# Patient Record
Sex: Female | Born: 1985 | Race: White | Hispanic: No | Marital: Married | Smoking: Former smoker
Health system: Southern US, Community
[De-identification: ages and names within clinical notes are randomized; demographics above are authoritative.]

## PROBLEM LIST (undated history)

## (undated) DIAGNOSIS — N200 Calculus of kidney: Secondary | ICD-10-CM

## (undated) DIAGNOSIS — B019 Varicella without complication: Secondary | ICD-10-CM

## (undated) DIAGNOSIS — F411 Generalized anxiety disorder: Secondary | ICD-10-CM

## (undated) HISTORY — DX: Generalized anxiety disorder: F41.1

## (undated) HISTORY — DX: Calculus of kidney: N20.0

## (undated) HISTORY — PX: LITHOTRIPSY: SUR834

## (undated) HISTORY — DX: Varicella without complication: B01.9

## (undated) HISTORY — PX: URETERAL STENT PLACEMENT: SHX822

---
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
## 2006-12-12 DIAGNOSIS — B009 Herpesviral infection, unspecified: Secondary | ICD-10-CM

## 2006-12-12 HISTORY — DX: Herpesviral infection, unspecified: B00.9

## 2012-05-12 ENCOUNTER — Ambulatory Visit (INDEPENDENT_AMBULATORY_CARE_PROVIDER_SITE_OTHER): Payer: 59 | Admitting: Physician Assistant

## 2012-05-12 ENCOUNTER — Other Ambulatory Visit: Payer: Self-pay | Admitting: Physician Assistant

## 2012-05-12 VITALS — BP 120/76 | HR 80 | Temp 98.6°F | Resp 18 | Ht 64.0 in | Wt 125.0 lb

## 2012-05-12 DIAGNOSIS — J029 Acute pharyngitis, unspecified: Secondary | ICD-10-CM

## 2012-05-12 DIAGNOSIS — R599 Enlarged lymph nodes, unspecified: Secondary | ICD-10-CM

## 2012-05-12 DIAGNOSIS — R590 Localized enlarged lymph nodes: Secondary | ICD-10-CM

## 2012-05-12 DIAGNOSIS — R509 Fever, unspecified: Secondary | ICD-10-CM

## 2012-05-12 LAB — POCT CBC
HCT, POC: 43.9 % (ref 37.7–47.9)
Hemoglobin: 13.4 g/dL (ref 12.2–16.2)
MCHC: 30.5 g/dL — AB (ref 31.8–35.4)
MID (cbc): 0.8 (ref 0–0.9)
MPV: 9.1 fL (ref 0–99.8)
Platelet Count, POC: 272 10*3/uL (ref 142–424)
RBC: 4.4 M/uL (ref 4.04–5.48)
RDW, POC: 13.2 %

## 2012-05-12 LAB — POCT INFLUENZA A/B: Influenza A, POC: NEGATIVE

## 2012-05-12 LAB — POCT RAPID STREP A (OFFICE): Rapid Strep A Screen: NEGATIVE

## 2012-05-12 MED ORDER — CLARITHROMYCIN ER 500 MG PO TB24: 1000.0000 mg | ORAL_TABLET | Freq: Every day | ORAL | Status: DC

## 2012-05-12 MED ORDER — FIRST-DUKES MOUTHWASH MT SUSP: 10.0000 mL | OROMUCOSAL | Status: DC | PRN

## 2012-05-12 NOTE — Patient Instructions (Addendum)
Take the antibiotic as directed.  Use Magic Mouthwash as frequently as every 2 hours for sore throat.  Continue using Advil for sore throat, use up to 800mg  3 times daily.  Drink plenty of water and get plenty of rest.  Do not smoke.  I will let you know when I have culture data back.  Please let us know if you are worsening or not improving.

## 2012-05-12 NOTE — Progress Notes (Signed)
Subjective:    Patient ID: Pamela Richards, female    DOB: Oct 09, 1985, 26 y.o.   MRN: [id number]  HPI   Ms. Pamela Richards is a 25 yr old female here with sore throat, lymphadenopathy, low grade fever, generalized muscle/body aches.  Symptoms began 2 days ago.  Throat is sore and "swollen", hurts to flex neck, hurts to talk, hurts to swallow.  No difficulty swallowing.  Some ear pain.  Cough is non-productive.  Denies NVD.  Does have muscle and body aches.  Tmax 100F.  Has been alternating Tylenol and Motrin q2hrs since Saturday night.  Yesterday stopped tylenol and began using Dayquil/Nyqyil.  Currently smokes 1/2 ppd but has not been smoking while sick.  Few sick contacts at work this week.  Has not had flu shot.  State she has had mono previously.      Review of Systems  Constitutional: Positive for fever. Negative for chills and appetite change.  HENT: Positive for ear pain, congestion, sore throat, rhinorrhea, neck pain, postnasal drip and sinus pressure. Negative for trouble swallowing.   Respiratory: Positive for cough. Negative for shortness of breath and wheezing.   Cardiovascular: Negative.   Gastrointestinal: Negative.   Musculoskeletal: Positive for myalgias and arthralgias.  Skin: Negative.   Neurological: Positive for headaches. Negative for dizziness, syncope and light-headedness.       Objective:   Physical Exam  Vitals reviewed. Constitutional: She is oriented to person, place, and time. She appears well-developed and well-nourished. No distress.  HENT:  Head: Normocephalic and atraumatic.  Right Ear: Ear canal normal. Tympanic membrane is injected and retracted.  Left Ear: Tympanic membrane and ear canal normal.  Nose: Nose normal. Right sinus exhibits no maxillary sinus tenderness and no frontal sinus tenderness. Left sinus exhibits no maxillary sinus tenderness and no frontal sinus tenderness.  Mouth/Throat: Uvula is midline and mucous membranes are normal. Posterior  oropharyngeal edema and posterior oropharyngeal erythema present. No oropharyngeal exudate or tonsillar abscesses.  Neck: Normal range of motion. Neck supple. Muscular tenderness present. No spinous process tenderness present. No rigidity. No Brudzinski's sign and no Kernig's sign noted.  Cardiovascular: Normal rate, regular rhythm, normal heart sounds and intact distal pulses.  Exam reveals no gallop and no friction rub.   No murmur heard. Pulmonary/Chest: Effort normal and breath sounds normal. She has no wheezes. She has no rales.  Abdominal: Soft. Bowel sounds are normal. There is no tenderness. There is no rebound and no guarding.  Lymphadenopathy:       Head (right side): Submandibular adenopathy present.       Head (left side): Submandibular adenopathy present.    She has cervical adenopathy.       Right cervical: Superficial cervical adenopathy present.  Neurological: She is alert and oriented to person, place, and time.  Skin: Skin is warm and dry.  Psychiatric: She has a normal mood and affect. Her behavior is normal.      Filed Vitals:   05/12/12 1052  BP: 120/76  Pulse: 80  Temp: 98.6 F (37 C)  Resp: 18      Results for orders placed in visit on 05/12/12  POCT RAPID STREP A (OFFICE)      Component Value Range   Rapid Strep A Screen Negative  Negative  POCT INFLUENZA A/B      Component Value Range   Influenza A, POC Negative     Influenza B, POC Negative    POCT CBC      Component  Value Range   WBC 14.2 (*) 4.6 - 10.2 K/uL   Lymph, poc 2.7  0.6 - 3.4   POC LYMPH PERCENT 19.1  10 - 50 %L   MID (cbc) 0.8  0 - 0.9   POC MID % 5.6  0 - 12 %M   POC Granulocyte 10.7 (*) 2 - 6.9   Granulocyte percent 75.3  37 - 80 %G   RBC 4.40  4.04 - 5.48 M/uL   Hemoglobin 13.4  12.2 - 16.2 g/dL   HCT, POC 16.1  09.6 - 47.9 %   MCV 99.7 (*) 80 - 97 fL   MCH, POC 30.5  27 - 31.2 pg   MCHC 30.5 (*) 31.8 - 35.4 g/dL   RDW, POC 04.5     Platelet Count, POC 272  142 - 424 K/uL     MPV 9.1  0 - 99.8 fL        Assessment & Plan:   1. Sore throat  POCT rapid strep A, POCT CBC, Throat culture, Diphenhyd-Hydrocort-Nystatin (FIRST-DUKES MOUTHWASH) SUSP, clarithromycin (BIAXIN XL) 500 MG 24 hr tablet  2. Anterior cervical adenopathy  POCT rapid strep A, POCT CBC, Throat culture, clarithromycin (BIAXIN XL) 500 MG 24 hr tablet  3. Fever  POCT rapid strep A, POCT Influenza A/B, POCT CBC, Throat culture     Ms. Pamela Richards is a 26 yr old female with sore throat, cervical lymphadenopathy, and fever.  Rapid strep is negative.  Rapid flu is negative.  WBC count is 14.2, diff is normal.  Throat culture is pending through [facility] as pt is an employee there.  Throat is erythematous but without exudate.  There is no tonsillar abscess.  Sinuses are nontender.  Cough is minimal and non-productive.  Etiology of infection is unclear, but will cover for upper respiratory infection/pharyngitis.  Given pt allergies will treat with clarithromycin.  Will also treat symptoms with Magic Mouthwash and otc NSAIDs.  Considered a steroid to reduce throat inflammation/pain, but am concerned about losing the ability to follow her white count.  I am hopeful that getting abx on board will reduce the pain and inflammation.  Discussed RTC precautions and patient is in agreement with this plan.

## 2012-05-19 ENCOUNTER — Telehealth: Payer: Self-pay | Admitting: *Deleted

## 2012-05-19 NOTE — Telephone Encounter (Signed)
[facility] could not use specimen that we sent to them, due to it not being the gel.  Throat culture was not performed.  Would you like for [place] to call pt to check her status?

## 2012-05-19 NOTE — Telephone Encounter (Signed)
Please call pt and explain that [facility] did not accept our culture, so it was not done.  Hopefully she is feeling better after the antibiotics

## 2012-05-19 NOTE — Telephone Encounter (Signed)
Called patient she is advised, she feels better and states this is fine,, her lymph nodes have decreased in size and her pharyngitis has resolved. Roddy Bellamy

## 2016-06-01 ENCOUNTER — Ambulatory Visit (INDEPENDENT_AMBULATORY_CARE_PROVIDER_SITE_OTHER): Payer: 59 | Admitting: Family Medicine

## 2016-06-01 ENCOUNTER — Encounter: Payer: Self-pay | Admitting: Family Medicine

## 2016-06-01 VITALS — BP 107/70 | HR 84 | Temp 98.6°F | Ht 63.0 in | Wt 131.4 lb

## 2016-06-01 DIAGNOSIS — F411 Generalized anxiety disorder: Secondary | ICD-10-CM | POA: Insufficient documentation

## 2016-06-01 DIAGNOSIS — N200 Calculus of kidney: Secondary | ICD-10-CM | POA: Diagnosis not present

## 2016-06-01 DIAGNOSIS — L709 Acne, unspecified: Secondary | ICD-10-CM | POA: Insufficient documentation

## 2016-06-01 DIAGNOSIS — Z0000 Encounter for general adult medical examination without abnormal findings: Secondary | ICD-10-CM | POA: Diagnosis not present

## 2016-06-01 DIAGNOSIS — Z23 Encounter for immunization: Secondary | ICD-10-CM

## 2016-06-01 MED ORDER — ADAPALENE-BENZOYL PEROXIDE 0.1-2.5 % EX GEL: CUTANEOUS | 5 refills | Status: DC

## 2016-06-01 NOTE — Progress Notes (Signed)
Pre visit review using our clinic review tool, if applicable. No additional management support is needed unless otherwise documented below in the visit note. 

## 2016-06-01 NOTE — Patient Instructions (Addendum)
Tdap today  Sign release of information at the check out desk for last pap smear only  Schedule a lab visit at the check out desk within 2 weeks. Return for future fasting labs meaning nothing but water after midnight please. Ok to take your medications with water. Could not order the urine at [facility] as a point of care test  Mychart?   Trial epiduo for 1 month. If not doing better- consider oral antibiotic. Will need husbands help for back application  Adapalene; Benzoyl Peroxide topical gel What is this medicine? ADAPALENE; BENZOYL PEROXIDE (a DAP a leen; BEN zoe ill per OX ide) is used on the skin to treat acne. This medicine may be used for other purposes; ask your health care provider or pharmacist if you have questions. COMMON BRAND NAME(S): Epiduo What should I tell my health care provider before I take this medicine? They need to know if you have any of these conditions: -eczema -seborrheic dermatitis -skin abrasions -sunburn -an unusual or allergic reaction to adapalene, benzoyl peroxide, vitamin A, other medicines, foods, dyes, or preservatives -pregnant or trying to get pregnant -breast-feeding How should I use this medicine? This medicine is for external use only. Follow the directions on the prescription label. Cleanse the affected area with a mild or soapless cleanser and pat dry. Apply a thin layer of medicine to the affected area. Rub in gently. Do not get in the eyes, on the lips, or on any other areas of sensitive skin. Use your medicine at regular intervals. Do not use it more often than directed. Talk to your pediatrician regarding the use of this medicine in children. Special care may be needed. Overdosage: If you think you have taken too much of this medicine contact a poison control center or emergency room at once. NOTE: This medicine is only for you. Do not share this medicine with others. What if I miss a dose? If you miss a dose, use it as soon as you can. If  it is almost time for your next dose, use only that dose. Do not use double or extra doses. What may interact with this medicine? -other acne medicines -salicylic acid or sulfur containing products -topical antibiotics like clindamycin or erythromycin This list may not describe all possible interactions. Give your health care provider a list of all the medicines, herbs, non-prescription drugs, or dietary supplements you use. Also tell them if you smoke, drink alcohol, or use illegal drugs. Some items may interact with your medicine. What should I watch for while using this medicine? Your acne may get worse at first, and then should start to get better. It may take 2 to 12 weeks before you see the full effect. Do not use products that may dry the skin like medicated cosmetics, products that contain alcohol, astringents, spices, limes, or abrasive soaps or cleaners. Do not use other acne or skin treatments on the same area that you use this medicine unless your doctor or health care professional tells you to. If you use these together they can cause severe skin irritation. This medicine can make you more sensitive to the sun. Keep out of the sun. If you cannot avoid being in the sun, wear protective clothing and use sunscreen. Do not use sun lamps or tanning beds/booths. This medicine may bleach hair or colored fabrics. Avoid getting the medicine on your clothes. What side effects may I notice from receiving this medicine? Side effects that you should report to your doctor or health  care professional as soon as possible: -allergic reactions like skin rash, itching or hives, swelling of the face, lips, or tongue -severe burning, redness, crusting, or swelling of the treated areas Side effects that usually do not require medical attention (report to your doctor or health care professional if they continue or are bothersome): -increased sensitivity to the sun -inflamed, stinging, and irritated  skin -skin that peels after a few days of use This list may not describe all possible side effects. Call your doctor for medical advice about side effects. You may report side effects to FDA at 1-800-FDA-1088. Where should I keep my medicine? Keep out of the reach of children. Store at room temperature between 15 and 30 degrees C (59 and 86 degrees F). Protect from light. Keep container tightly closed. Throw away any unused medication after the expiration date. NOTE: This sheet is a summary. It may not cover all possible information. If you have questions about this medicine, talk to your doctor, pharmacist, or health care provider.  2017 Elsevier/Gold Standard (2007-08-26 17:41:57)

## 2016-06-01 NOTE — Progress Notes (Addendum)
Phone: [phone]  Subjective:  Patient presents today for their annual medical physical and to establish care with me as PCP (prior Dr. Caryl NeverBurchette years ago). Chief complaint-noted.   See problem oriented charting- Review of Systems  Constitutional: Negative for chills and fever.  HENT: Negative for hearing loss and tinnitus.   Eyes: Negative for blurred vision and double vision.  Respiratory: Negative for cough and hemoptysis.   Cardiovascular: Negative for chest pain and palpitations.  Gastrointestinal: Negative for heartburn and nausea.  Genitourinary: Negative for dysuria and urgency.  Musculoskeletal: Negative for myalgias and neck pain.  Skin: Negative for itching and rash.  Neurological: Positive for headaches. Negative for dizziness.  Endo/Heme/Allergies: Negative for polydipsia. Does not bruise/bleed easily.  Psychiatric/Behavioral: Negative for depression. The patient is nervous/anxious.    The following were reviewed and entered/updated in epic: Past Medical History:  Diagnosis Date  . Chicken pox   . GAD (generalized anxiety disorder)    also history of depression now controlled. zoloft workedbest. Lexapro had to take same time every day or SE.   Marland Kitchen. Kidney stones    [facility] prior   Patient Active Problem List   Diagnosis Date Noted  . Acne 06/01/2016  . GAD (generalized anxiety disorder)   . Kidney stones    Past Surgical History:  Procedure Laterality Date  . LITHOTRIPSY    . URETERAL STENT PLACEMENT     x2    Family History  Problem Relation Age of Onset  . Cancer Paternal Grandmother     Medications- reviewed and updated, no meds prior to visit  Allergies-reviewed and updated Allergies  Allergen Reactions  . Bactrim [Sulfamethoxazole-Trimethoprim]   . Ceclor [Cefaclor]   . Penicillins     Social History   Social History  . Marital status: Married    Spouse name: N/A  . Number of children: N/A  . Years of education: N/A    Social History Main Topics  . Smoking status: Current Every Day Smoker    Packs/day: 2.00    Types: Cigarettes  . Smokeless tobacco: Current User  . Alcohol use Yes  . Drug use: No  . Sexual activity: Yes    Birth control/ protection: Pill   Other Topics Concern  . None   Social History Narrative   Married. Husband Ronaldo MiyamotoKyle patient of Dr. Durene CalHunter. No kids. 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 Oropharynx normal Neck: no thyromegaly CV: RRR no murmurs rubs or gallops Lungs: CTAB no crackles, wheeze, rhonchi Abdomen: soft/nontender/nondistended/normal bowel sounds. No rebound or guarding.  Ext: no edema Skin: warm, dry, pustules and papules on chin as well as all over back Neuro: grossly normal, moves all extremities, PERRLA  Assessment/Plan:  30 y.o. female presenting for annual physical.  Health Maintenance counseling: 1. Anticipatory guidance: Patient counseled regarding regular dental exams, eye exams- glasses, wearing seatbelts.  2. Risk factor reduction:  Advised patient of need for regular exercise (active at work but advised regular exercise outside of  work) and diet rich and fruits and vegetables to reduce risk of heart attack and stroke. Healthy weight.  3. Immunizations/screenings/ancillary studies Immunization History  Administered Date(s) Administered  . Influenza-Unspecified 01/31/2016  . Tdap 06/01/2016   Health Maintenance Due  Topic Date Due  . HIV Screening - had previously in college 03/06/2002  . TETANUS/TDAP - today 03/06/2006  . PAP SMEAR - yearly records  03/06/2008  . INFLUENZA VACCINE - had at work 01/10/2016   4. Cervical cancer screening- will get records 5. Breast cancer screening-  No family history- start 35-40 but does breast exams with gyn and mammogram  6. Colon cancer screening - no family history, start at age 29. Has had colonoscopy for GI issues normal in around 2009 7. Skin cancer screening- no concerns  Status of chronic or acute concerns  Tdap today  Sign release of information at the check out desk for last pap smear only  Schedule a lab visit at the check out desk within 2 weeks. Return for future fasting labs meaning nothing but water after midnight please. Ok to take your medications with water. Could not order the urine at [facility] as a point of care test  Mychart? Plans to sign up her and husband at home  Advised quit smoking  Cut alcohol down to at least 7 or less per week- at about 12 at times.   Acne Trial epiduo for 1 month. If not doing better- consider oral antibiotic. Will need husbands help for back application. apprently with very sensitive skin and some discomfort with topicals in past- we discussed dermatology referral if cannot tolerate topicals as advised to have these in place before oral antibiotics.    1 year CPE likely, back for fasting labs  Orders Placed This Encounter  Procedures  . Tdap vaccine greater than or equal to 7yo IM  . Comprehensive metabolic panel    Volo    Standing Status:   Future    Standing Expiration Date:   06/01/2017  . CBC    Standing Status:   Future    Standing Expiration Date:   06/01/2017  . Lipid panel    Standing Status:   Future    Standing Expiration Date:   06/01/2017  . TSH    Standing Status:   Future    Standing Expiration Date:   06/01/2017  . POCT Urinalysis Dipstick (Automated)    Standing Status:   Future    Standing Expiration Date:   06/01/2017    Meds ordered this encounter  Medications  . Adapalene-Benzoyl Peroxide 0.1-2.5 % gel     Sig: Apply a pea-sized amount for each area of the face and/or trunk (eg, forehead, chin, each cheek). Skin should be clean and dry before applying. For external use only; avoid applying to eyes, lips, and mucous membranes. Do not apply to cuts, abrasions, eczematous or sunburned skin.    Dispense:  45 g    Refill:  5    Return precautions advised.   Tana ConchStephen Rhylen Shaheen, MD

## 2016-06-01 NOTE — Assessment & Plan Note (Signed)
Trial epiduo for 1 month. If not doing better- consider oral antibiotic. Will need husbands help for back application. apprently with very sensitive skin and some discomfort with topicals in past- we discussed dermatology referral if cannot tolerate topicals as advised to have these in place before oral antibiotics.

## 2016-06-08 ENCOUNTER — Other Ambulatory Visit (INDEPENDENT_AMBULATORY_CARE_PROVIDER_SITE_OTHER): Payer: 59

## 2016-06-08 DIAGNOSIS — Z0000 Encounter for general adult medical examination without abnormal findings: Secondary | ICD-10-CM

## 2016-06-08 LAB — POC URINALSYSI DIPSTICK (AUTOMATED)
Blood, UA: NEGATIVE
Glucose, UA: NEGATIVE
Ketones, UA: NEGATIVE
LEUKOCYTES UA: NEGATIVE
NITRITE UA: NEGATIVE
PH UA: 6
Spec Grav, UA: 1.025
Urobilinogen, UA: 0.2

## 2016-06-08 LAB — LIPID PANEL
Cholesterol: 123 mg/dL (ref 0–200)
HDL: 45 mg/dL (ref 35–70)
LDL CALC: 65 mg/dL

## 2016-06-08 LAB — CBC AND DIFFERENTIAL
HCT: 41 % (ref 36–46)
Hemoglobin: 13.5 g/dL (ref 12.0–16.0)

## 2016-06-08 LAB — BASIC METABOLIC PANEL
BUN: 10 mg/dL (ref 4–21)
CREATININE: 0.7 mg/dL (ref <=1.1)
Glucose: 82 mg/dL

## 2016-06-09 LAB — CBC
HEMATOCRIT: 40.5 % (ref 34.0–46.6)
HEMOGLOBIN: 13.5 g/dL (ref 11.1–15.9)
MCH: 30.8 pg (ref 26.6–33.0)
MCHC: 33.3 g/dL (ref 31.5–35.7)
MCV: 92 fL (ref 79–97)
Platelets: 272 10*3/uL (ref 150–379)
RBC: 4.39 x10E6/uL (ref 3.77–5.28)
RDW: 13.7 % (ref 12.3–15.4)
WBC: 6 10*3/uL (ref 3.4–10.8)

## 2016-06-09 LAB — COMPREHENSIVE METABOLIC PANEL
ALK PHOS: 72 IU/L (ref 39–117)
ALT: 12 IU/L (ref 0–32)
AST: 15 IU/L (ref 0–40)
Albumin/Globulin Ratio: 1.9 (ref 1.2–2.2)
Albumin: 4.2 g/dL (ref 3.5–5.5)
BILIRUBIN TOTAL: 0.4 mg/dL (ref 0.0–1.2)
BUN/Creatinine Ratio: 14 (ref 9–23)
BUN: 10 mg/dL (ref 6–20)
CHLORIDE: 103 mmol/L (ref 96–106)
CO2: 21 mmol/L (ref 18–29)
Calcium: 9 mg/dL (ref 8.7–10.2)
Creatinine, Ser: 0.71 mg/dL (ref 0.57–1.00)
GFR calc Af Amer: 133 mL/min/{1.73_m2} (ref >59)
GFR calc non Af Amer: 115 mL/min/{1.73_m2} (ref >59)
GLUCOSE: 82 mg/dL (ref 65–99)
Globulin, Total: 2.2 g/dL (ref 1.5–4.5)
Potassium: 4.2 mmol/L (ref 3.5–5.2)
Sodium: 141 mmol/L (ref 134–144)
Total Protein: 6.4 g/dL (ref 6.0–8.5)

## 2016-06-09 LAB — LIPID PANEL
CHOL/HDL RATIO: 2.7 ratio (ref 0.0–4.4)
CHOLESTEROL TOTAL: 123 mg/dL (ref 100–199)
HDL: 45 mg/dL (ref >39)
LDL CALC: 65 mg/dL (ref 0–99)
TRIGLYCERIDES: 63 mg/dL (ref 0–149)
VLDL Cholesterol Cal: 13 mg/dL (ref 5–40)

## 2016-06-09 LAB — TSH: TSH: 0.959 u[IU]/mL (ref 0.450–4.500)

## 2016-06-12 ENCOUNTER — Encounter: Payer: Self-pay | Admitting: Family Medicine

## 2017-07-17 LAB — HM PAP SMEAR: HM Pap smear: NORMAL

## 2018-08-11 LAB — HM PAP SMEAR
HM Pap smear: NORMAL
HPV, high-risk: NEGATIVE

## 2018-08-20 ENCOUNTER — Other Ambulatory Visit: Payer: Self-pay

## 2018-08-20 ENCOUNTER — Encounter: Payer: Self-pay | Admitting: Physician Assistant

## 2018-08-20 ENCOUNTER — Ambulatory Visit (INDEPENDENT_AMBULATORY_CARE_PROVIDER_SITE_OTHER): Payer: 59

## 2018-08-20 ENCOUNTER — Ambulatory Visit: Payer: 59 | Admitting: Family Medicine

## 2018-08-20 ENCOUNTER — Ambulatory Visit (INDEPENDENT_AMBULATORY_CARE_PROVIDER_SITE_OTHER): Payer: 59 | Admitting: Physician Assistant

## 2018-08-20 DIAGNOSIS — M25512 Pain in left shoulder: Secondary | ICD-10-CM | POA: Diagnosis not present

## 2018-08-20 DIAGNOSIS — M542 Cervicalgia: Secondary | ICD-10-CM | POA: Diagnosis not present

## 2018-08-20 DIAGNOSIS — M79605 Pain in left leg: Secondary | ICD-10-CM | POA: Diagnosis not present

## 2018-08-20 MED ORDER — CYCLOBENZAPRINE HCL 5 MG PO TABS: 5.0000 mg | ORAL_TABLET | Freq: Three times a day (TID) | ORAL | 0 refills | Status: DC | PRN

## 2018-08-20 NOTE — Patient Instructions (Signed)
It was great to see you!  We will be in touch with your xray results.  Use ibuprofen and daily antacid (pepcid/prilosec/etc).  Flexeril as needed for muscle spasms.  Take care,  Jarold Motto PA-C
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
## 2018-09-29 ENCOUNTER — Encounter: Payer: Self-pay | Admitting: Family Medicine

## 2021-04-25 ENCOUNTER — Ambulatory Visit (INDEPENDENT_AMBULATORY_CARE_PROVIDER_SITE_OTHER): Payer: 59 | Admitting: Family Medicine

## 2021-04-25 ENCOUNTER — Other Ambulatory Visit: Payer: Self-pay | Admitting: Family Medicine

## 2021-04-25 ENCOUNTER — Ambulatory Visit (HOSPITAL_COMMUNITY)
Admission: RE | Admit: 2021-04-25 | Discharge: 2021-04-25 | Disposition: A | Discharge status: Home / Self Care | Payer: 59 | Source: Ambulatory Visit | Attending: Family Medicine | Admitting: Family Medicine

## 2021-04-25 ENCOUNTER — Encounter: Payer: Self-pay | Admitting: Family Medicine

## 2021-04-25 ENCOUNTER — Ambulatory Visit: Payer: 59 | Admitting: Physician Assistant

## 2021-04-25 ENCOUNTER — Other Ambulatory Visit: Payer: Self-pay

## 2021-04-25 VITALS — BP 110/80 | HR 80 | Temp 98.3°F | Ht 63.0 in | Wt 143.6 lb

## 2021-04-25 DIAGNOSIS — N200 Calculus of kidney: Secondary | ICD-10-CM

## 2021-04-25 DIAGNOSIS — R109 Unspecified abdominal pain: Secondary | ICD-10-CM | POA: Insufficient documentation

## 2021-04-25 LAB — CBC WITH DIFFERENTIAL/PLATELET
Basophils Absolute: 0 10*3/uL (ref 0.0–0.1)
Basophils Relative: 0.9 % (ref 0.0–3.0)
Eosinophils Absolute: 0.2 10*3/uL (ref 0.0–0.7)
Eosinophils Relative: 2.9 % (ref 0.0–5.0)
HCT: 41.3 % (ref 36.0–46.0)
Hemoglobin: 13.8 g/dL (ref 12.0–15.0)
Lymphocytes Relative: 39.9 % (ref 12.0–46.0)
Lymphs Abs: 2.1 10*3/uL (ref 0.7–4.0)
MCHC: 33.4 g/dL (ref 30.0–36.0)
MCV: 96.7 fl (ref 78.0–100.0)
Monocytes Absolute: 0.5 10*3/uL (ref 0.1–1.0)
Monocytes Relative: 9.5 % (ref 3.0–12.0)
Neutro Abs: 2.4 10*3/uL (ref 1.4–7.7)
Neutrophils Relative %: 46.8 % (ref 43.0–77.0)
Platelets: 314 10*3/uL (ref 150.0–400.0)
RBC: 4.27 Mil/uL (ref 3.87–5.11)
RDW: 12.8 % (ref 11.5–15.5)
WBC: 5.2 10*3/uL (ref 4.0–10.5)

## 2021-04-25 LAB — POC URINALSYSI DIPSTICK (AUTOMATED)
Bilirubin, UA: NEGATIVE
Blood, UA: NEGATIVE
Glucose, UA: NEGATIVE
Ketones, UA: NEGATIVE
Leukocytes, UA: NEGATIVE
Nitrite, UA: NEGATIVE
Protein, UA: NEGATIVE
Spec Grav, UA: >=1.03 — AB (ref 1.010–1.025)
Urobilinogen, UA: 0.2 E.U./dL
pH, UA: 6 (ref 5.0–8.0)

## 2021-04-25 LAB — COMPREHENSIVE METABOLIC PANEL
ALT: 11 U/L (ref 0–35)
AST: 14 U/L (ref 0–37)
Albumin: 4.4 g/dL (ref 3.5–5.2)
Alkaline Phosphatase: 72 U/L (ref 39–117)
BUN: 16 mg/dL (ref 6–23)
CO2: 26 mEq/L (ref 19–32)
Calcium: 9 mg/dL (ref 8.4–10.5)
Chloride: 107 mEq/L (ref 96–112)
Creatinine, Ser: 0.64 mg/dL (ref 0.40–1.20)
GFR: 115.53 mL/min (ref >60.00)
Glucose, Bld: 92 mg/dL (ref 70–99)
Potassium: 4 mEq/L (ref 3.5–5.1)
Sodium: 139 mEq/L (ref 135–145)
Total Bilirubin: 0.5 mg/dL (ref 0.2–1.2)
Total Protein: 7.1 g/dL (ref 6.0–8.3)

## 2021-04-25 MED ORDER — DICLOFENAC SODIUM 50 MG PO TBEC: 50.0000 mg | DELAYED_RELEASE_TABLET | Freq: Three times a day (TID) | ORAL | 1 refills | Status: DC

## 2021-04-25 MED ORDER — OXYCODONE-ACETAMINOPHEN 5-325 MG PO TABS: 1.0000 | ORAL_TABLET | ORAL | 0 refills | Status: AC | PRN

## 2021-04-25 NOTE — Addendum Note (Signed)
Addended by: Lorn Junes on: 04/25/2021 10:25 AM   Modules accepted: Orders

## 2021-04-25 NOTE — Progress Notes (Signed)
Phone [phone] In person visit   Subjective:   Pamela Richards is a 35 y.o. year old very pleasant female patient who presents for/with See problem oriented charting Chief Complaint  Patient presents with   Kidney Stone    Pt c/o kidney pain off and on for several weeks and got worse Sunday morning. Pt has taken a demerol and it helped.   This visit occurred during the SARS-CoV-2 public health emergency.  Safety protocols were in place, including screening questions prior to the visit, additional usage of staff PPE, and extensive cleaning of exam room while observing appropriate contact time as indicated for disinfecting solutions.   Past Medical History-  Patient Active Problem List   Diagnosis Date Noted   Acne 06/01/2016   GAD (generalized anxiety disorder)    Kidney stones     Medications- reviewed and updated No medication prior to visit    Objective:  BP 110/80   Pulse 80   Temp 98.3 F (36.8 C)   Ht 5\' 3"  (1.6 m)   Wt 143 lb 9.6 oz (65.1 kg)   SpO2 98%   BMI 25.44 kg/m  Gen: Appears uncomfortable/in pain CV: RRR no murmurs rubs or gallops Lungs: CTAB no crackles, wheeze, rhonchi Abdomen: soft/nontender/nondistended/normal bowel sounds. No rebound or guarding.  -Points to CVA as source of pain but no significant worsening of pain with palpation Ext: no edema Skin: warm, dry     Assessment and Plan  # Kidney Stone concern due to left CVA pain   S:Patient reports noticed some intermittent back pain for last few weeks but on Sunday morning started with pain in left CVA area- severe pain up to 10/10- laying on heating pad. Stayed home yesterday from work with pain coming in waves up to 6/10 and still doing the same today. Old pain medicine demerol helpful for pain- when wearing off pain would spike again. No blood in urine. Normal urination. Drinking lots of fluids.  -No dysuria or polyuria  At least 5 years since last kidney stone- urologist is out of  [place].    Condoms for birth control- LMP 04/18/21 and regular.  A/P: Patient with left CVA pain and history of kidney stones-strongly suspect left-sided kidney stone-update labs today to evaluate renal function, kidney function, and anemia.  Stat renal stone study and likely refer to urology depending on size-also consider Flomax (depending on size) and NSAIDs-1 to make sure renal function okay for NSAIDs first  # Current smoker S:2-3 cigarettes per week. Goal to quit by end of year.   A/P: Not ready to quit at the moment but strongly encourage cessation  Recommended follow up: Return for as needed for new, worsening, persistent symptoms. Future Appointments  Date Time Provider Department Center  04/25/2021 10:20 AM 04/27/2021, MD [facility]   Lab/Order associations:   ICD-10-CM   1. Kidney stone  N20.0     2. Left flank pain  R10.9 Comprehensive metabolic panel    CBC with Differential/Platelet    POCT Urinalysis Dipstick (Automated)    CT RENAL STONE STUDY    3. Abdominal pain, unspecified abdominal location  R10.9       Meds ordered this encounter  Medications   oxyCODONE-acetaminophen (PERCOCET/ROXICET) 5-325 MG tablet    Sig: Take 1 tablet by mouth every 4 (four) hours as needed for up to 5 days for severe pain (do not drive for 8 hours after taking. kidney stone pain).    Dispense:  20 tablet    Refill:  0    Time Spent: 28 minutes of total time (9:47 AM- 10:15 AM) was spent on the date of the encounter performing the following actions: chart review prior to seeing the patient, obtaining history, performing a medically necessary exam, counseling on the treatment plan, placing orders, and documenting in our EHR.    I,Clevinger Phan,acting as a Neurosurgeon for Tana Conch, MD.,have documented all relevant documentation on the behalf of Tana Conch, MD,as directed by  Tana Conch, MD while in the presence of Tana Conch, MD.   I, Tana Conch, MD, have  reviewed all documentation for this visit. The documentation on 04/25/21 for the exam, diagnosis, procedures, and orders are all accurate and complete.   Return precautions advised.  Tana Conch, MD

## 2021-04-25 NOTE — Patient Instructions (Addendum)
Please stop by lab before you go If you have mychart- we will send your results within 3 business days of [place] receiving them.  If you do not have mychart- we will call you about results within 5 business days of [place] receiving them.  *please also note that you will see labs on mychart as soon as they post. I will later go in and write notes on them- will say "notes from Dr. Durene Cal"  Team please get patient set up for stat CT scan for kidney stones- already ordered  Start Percocet for every 4 hours as needed I would say for pain 5/10 or above - do not drive/operate heavy machinery for 8 hours after taking this medication.  Encourage complete smoking cessation for your long term health.  Recommended follow up: Return for as needed for new, worsening, persistent symptoms. - schedule physical within 6 months

## 2021-04-27 ENCOUNTER — Other Ambulatory Visit: Payer: Self-pay

## 2021-04-27 ENCOUNTER — Telehealth: Payer: Self-pay

## 2021-04-27 DIAGNOSIS — N200 Calculus of kidney: Secondary | ICD-10-CM

## 2021-04-27 NOTE — Telephone Encounter (Signed)
Unable to reach pt by phone, below message sent via mychart.

## 2021-04-27 NOTE — Telephone Encounter (Signed)
I looked this up after our visit yesterday and essentially Flomax works better for stone 5 mm up to 10 mm and hers is 4 mm.  That is why decided not to send in the Flomax because unclear if it would benefit her based on my reading  From up-to-date "In patients with ureteral stones >5 mm and ?10 mm in diameter, we suggest treatment with the alpha blocker tamsulosin (0.4 mg once daily) for up to four weeks to facilitate spontaneous stone passage. If tamsulosin is not available, use of another alpha blocker (such as terazosin, doxazosin, alfuzosin, or silodosin) is reasonable. Although most evidence exists for distal ureteral stones, given the low side effect profile of alpha blockers, we choose to give alpha blockers to patients with stones >5 mm and ?10 mm in any location of the ureter. Patients are then reimaged if spontaneous passage has not occurred (see 'Confirming stone passage' below). We do not administer an alpha blocker or any other form of MET in patients with ureteral stones ?5 mm or >10 mm in diameter."

## 2021-04-27 NOTE — Telephone Encounter (Signed)
Pt called and would like to know if a prescription for flomax could be sent in for her since you want her to try and pass the stone on her own.

## 2021-12-25 ENCOUNTER — Telehealth: Payer: Self-pay | Admitting: Family Medicine

## 2021-12-25 DIAGNOSIS — M549 Dorsalgia, unspecified: Secondary | ICD-10-CM

## 2021-12-25 NOTE — Telephone Encounter (Signed)
We could work her in but what about direct referral to sports medicine? They would be a great choice for this
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
## 2021-12-25 NOTE — Telephone Encounter (Signed)
See below

## 2021-12-26 NOTE — Telephone Encounter (Signed)
FYI-- Patient has been scheduled with Dr. Denyse Amass for 12/29/21 at 9:45am.

## 2021-12-26 NOTE — Telephone Encounter (Signed)
Referral to sports meds placed.

## 2021-12-28 NOTE — Progress Notes (Signed)
Subjective:    CC: Low back pain  I, Molly Weber, LAT, ATC, am serving as scribe for Dr. Clementeen Graham.  HPI: Pt is a 36 y/o female presenting w/ c/o low back pain x one week.  She locates her pain to her entire lower back, R>L, and radiating into her B hips/groin.  Per pt report, pt was seen at the ED in [place], [place] and had a CT scan that indicates that she has a R L5 pars interarticularis fx.  She states that her initial "injury" occurred when she leaned forward to dry herself off after getting out of the shower and heard/felt a pop in the R side of her low back/hip/abdomen area and has been having pain ever since.  Radiating pain: yes into her B ant hip, groin and ant thigh to her knee LE paresthesias: no Aggravating factors: general movement; sitting; unable to sleep well due to the pain Treatment tried: Naproxen, muscle relaxer, lidocaine patches  Pertinent review of Systems: No fevers or chills  Relevant historical information: Pars defect right side   Objective:    Vitals:   12/29/21 0938  BP: 100/70  Pulse: 81  SpO2: 98%   General: Well Developed, well nourished, and in no acute distress.   MSK: L-spine: Nontender midline.  Tender palpation right lumbar paraspinal musculature. Decreased lumbar motion with pain. Extremity strength is intact. Reflexes are intact distally.  Lab and Radiology Results  CT scan images visible on lumbar spine obtained during CT scan abdomen and pelvis November 2022 personally and independently interpreted. No visible pars defect present during this exam.   Impression and Recommendations:    Assessment and Plan: 36 y.o. female with right low back pain with pain radiating down the right leg in an L3 or L4 dermatomal pattern.  Patient had a CT scan obtained in the outside hospital which reportedly shows concern for a right L4-5 pars defects or pars fracture.  This occurred without trauma.  At the time of this note I do not have  access to these records but we have requested medical records and will addend the note once obtained. Compared to images from November 2022 this appears to be new.  Based on the degree of pain and radicular symptoms that she has we will obtain MRI of the lumbar spine to further characterize source of symptoms and for potential injection or other treatment planning.  For now MRI lumbar spine and refer to PT for core stabilization strategy.  Medication management including gabapentin and tizanidine.  Backup course of prednisone prescribed to use if worsening..  Likely will proceed directly to epidural steroid injection based on MRI.  PDMP not reviewed this encounter. Orders Placed This Encounter  Procedures   MR Lumbar Spine Wo Contrast    Standing Status:   Future    Standing Expiration Date:   12/30/2022    Order Specific Question:   What is the patient's sedation requirement?    Answer:   No Sedation    Order Specific Question:   Does the patient have a pacemaker or implanted devices?    Answer:   No    Order Specific Question:   Preferred imaging location?    Answer:   [facility] (table limit-350lbs)   Ambulatory referral to Physical Therapy    Referral Priority:   Routine    Referral Type:   Physical Medicine    Referral Reason:   Specialty Services Required    Requested Specialty:  Physical Therapy    Number of Visits Requested:   1   Meds ordered this encounter  Medications   predniSONE (DELTASONE) 50 MG tablet    Sig: Take 1 tablet (50 mg total) by mouth daily with breakfast for 5 days.    Dispense:  5 tablet    Refill:  0   gabapentin (NEURONTIN) 300 MG capsule    Sig: Take 1 capsule (300 mg total) by mouth 3 (three) times daily.    Dispense:  90 capsule    Refill:  2   tiZANidine (ZANAFLEX) 4 MG tablet    Sig: Take 1 tablet (4 mg total) by mouth every 8 (eight) hours as needed for muscle spasms.    Dispense:  30 tablet    Refill:  1    Discussed warning  signs or symptoms. Please see discharge instructions. Patient expresses understanding.   The above documentation has been reviewed and is accurate and complete Clementeen Graham, M.D.

## 2021-12-29 ENCOUNTER — Ambulatory Visit (INDEPENDENT_AMBULATORY_CARE_PROVIDER_SITE_OTHER): Payer: 59 | Admitting: Family Medicine

## 2021-12-29 ENCOUNTER — Encounter: Payer: Self-pay | Admitting: Family Medicine

## 2021-12-29 VITALS — BP 100/70 | HR 81 | Ht 63.0 in | Wt 144.6 lb

## 2021-12-29 DIAGNOSIS — M4306 Spondylolysis, lumbar region: Secondary | ICD-10-CM

## 2021-12-29 DIAGNOSIS — M5441 Lumbago with sciatica, right side: Secondary | ICD-10-CM | POA: Diagnosis not present

## 2021-12-29 MED ORDER — TIZANIDINE HCL 4 MG PO TABS: 4.0000 mg | ORAL_TABLET | Freq: Three times a day (TID) | ORAL | 1 refills | Status: DC | PRN

## 2021-12-29 MED ORDER — GABAPENTIN 300 MG PO CAPS: 300.0000 mg | ORAL_CAPSULE | Freq: Three times a day (TID) | ORAL | 2 refills | Status: DC

## 2021-12-29 MED ORDER — PREDNISONE 50 MG PO TABS: 50.0000 mg | ORAL_TABLET | Freq: Every day | ORAL | 0 refills | Status: AC

## 2021-12-29 NOTE — Patient Instructions (Addendum)
Nice to meet you today.  I've ordered a lumbar spine MRI to [facility].  They will call you to schedule but please let us know if you don't hear from them in one week regarding scheduling.  Gaba 300, prednisone, Tizanidine have been ordered to your pharmacy.  PT to [facility].  Their office will call you to schedule but please let us know if you don't hear from them in one week regarding scheduling.  Follow-up: 6 weeks

## 2022-01-01 ENCOUNTER — Ambulatory Visit (INDEPENDENT_AMBULATORY_CARE_PROVIDER_SITE_OTHER): Payer: 59 | Admitting: Physical Therapy

## 2022-01-01 ENCOUNTER — Encounter: Payer: Self-pay | Admitting: Physical Therapy

## 2022-01-01 DIAGNOSIS — M5459 Other low back pain: Secondary | ICD-10-CM | POA: Diagnosis not present

## 2022-01-01 NOTE — Therapy (Signed)
OUTPATIENT PHYSICAL THERAPY THORACOLUMBAR EVALUATION   Patient Name: Pamela Richards MRN: [id number] DOB:09-16-1985, 36 y.o., female Today's Date: 01/01/2022   PT End of Session - 01/07/22 2216     Visit Number 1    Number of Visits 16    Date for PT Re-Evaluation 02/12/22    Authorization Type [employer]    PT Start Time 0805    PT Stop Time 0845    PT Time Calculation (min) 40 min    Activity Tolerance Patient tolerated treatment well    Behavior During Therapy WFL for tasks assessed/performed              Past Medical History:  Diagnosis Date   Chicken pox    GAD (generalized anxiety disorder)    also history of depression now controlled. zoloft workedbest. Lexapro had to take same time every day or SE.    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   Kidney stones    [facility] prior   Past Surgical History:  Procedure Laterality Date   LITHOTRIPSY     URETERAL STENT PLACEMENT     x2   Patient Active Problem List   Diagnosis Date Noted   Acne 06/01/2016   GAD (generalized anxiety disorder)    Kidney stones     PCP: Tana Conch  REFERRING PROVIDER: Clementeen Graham  REFERRING DIAG: low back pain  Rationale for Evaluation and Treatment Rehabilitation  THERAPY DIAG:  Other low back pain  ONSET DATE: 2 weeks ago  SUBJECTIVE:                                                                                                                                                                                           SUBJECTIVE STATEMENT: 2 weeks ago, pt had incident where she bent over when getting out of shower, had had significant pain in back.  Has had previous back pain, not similar.  Difficulty sleeping, muscle relaxer- helps with sleep.  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  Unable to drive.  Stairs at home: no.  Previous/recent imaging- pt states pars defect or fracture. Getting another MRI later this week.    PERTINENT  HISTORY:     PAIN:  Are you having pain? Yes: NPRS scale: 7/10 Pain location: low back, down R LE, some tingling.  Pain description: achey,  Aggravating factors: sitting,  Relieving factors: none stated    PRECAUTIONS: Back     WEIGHT BEARING RESTRICTIONS No  FALLS:   Has patient fallen in last 6 months? No   PLOF: Independent  PATIENT GOALS   Decreased  pain, return to regular movement/activity.    OBJECTIVE:   DIAGNOSTIC FINDINGS:     COGNITION:  Overall cognitive status: Within functional limits for tasks assessed   PALPATION: Mild tenderness in low lumbar region.    LUMBAR ROM:   Active  AROM  eval  Flexion Mild/mod limitation/pain  Extension Not tested   Right lateral flexion Mild limitation/pain  Left lateral flexion Mild limitation/pain  Right rotation   Left rotation    (Blank rows = not tested) Hip ROM: WFL     LOWER EXTREMITY MMT:    MMT Right eval Left eval  Hip flexion 4 4  Hip extension    Hip abduction 4 4  Hip adduction    Hip internal rotation 4 4  Hip external rotation 4 4  Knee flexion 5 5  Knee extension 5 5  Ankle dorsiflexion    Ankle plantarflexion    Ankle inversion    Ankle eversion     (Blank rows = not tested)   LUMBAR SPECIAL TESTS:  Not tested    TODAY'S TREATMENT  See below for HEP    PATIENT EDUCATION:  Education details: PT POC, Exam findings, Precautions for movement, HEP Person educated: Patient Education method: Explanation, Demonstration, Tactile cues, Verbal cues, and Handouts Education comprehension: verbalized understanding, returned demonstration, verbal cues required, tactile cues required, and needs further education   HOME EXERCISE PROGRAM: Access Code: [email or web address] URL: [email or web address] Date: 01/03/2022 Prepared by: Sedalia Muta  Exercises - Hooklying Single Knee to Chest Stretch  - 2 x daily - 3 reps - 30 hold - Supine Piriformis Stretch Pulling Heel to Hip  - 2  x daily - 3 reps - 30 hold - Supine Transversus Abdominis Bracing - Hands on Ground  - 1 x daily - 2 sets - 10 reps - 5 hold - Hooklying Clamshell with Resistance  - 1 x daily - 1- 2 sets - 10 reps  ASSESSMENT:  CLINICAL IMPRESSION: Patient presents with primary complaint of increased pain in low back, radiating into LEs.  Recent imaging showing pars defect, but is having another MRI later this week. Pt with decreased ROM, and difficulty with functional movement due to pain. She has strength loss in hips and core. She will benefit from skilled PT to improve deficits, pain, and to improve/restore ability for functional activity and work duties. Discussed some precautions for extension and end range movements at this time.    OBJECTIVE IMPAIRMENTS decreased activity tolerance, decreased mobility, decreased strength, increased muscle spasms, and pain.   ACTIVITY LIMITATIONS carrying, lifting, bending, sitting, standing, squatting, stairs, bathing, and locomotion level  PARTICIPATION LIMITATIONS: cleaning, laundry, shopping, community activity, and yard work  PERSONAL FACTORS  none  are also affecting patient's functional outcome.   REHAB POTENTIAL: Good  CLINICAL DECISION MAKING: Stable/uncomplicated  EVALUATION COMPLEXITY: Low   GOALS: Goals reviewed with patient? Yes  SHORT TERM GOALS: Target date: 01/16/2022  Pt to be independent with initial HEP  Goal status: INITIAL  2.  Pt to demo ability for transfers with mechanics WFL, and minimal pain.   Goal status: INITIAL  3.  Pt to report ability to bend/flex for ADLS with optimal body mechanics and minimal pain.   Goal status: INITIAL    LONG TERM GOALS: Target date: 02/27/2022  Pt to be independent with final HEP  Goal status: INITIAL  2.Pt to report decreased pain in back and LEs to 0-2/10 with work duties and functional activity.   Goal status: INITIAL  3.  Pt to demo ability to bend/squat with optimal body mechanics,  for improved ability for IADLs.   Goal status: INITIAL  4.  Pt to demo improved strength of hips and core to be at least 4+/5/ [facility] for pt age and dx.   Goal status: INITIAL       PLAN: PT FREQUENCY: 1-2x/week  PT DURATION: 8 weeks  PLANNED INTERVENTIONS: Therapeutic exercises, Therapeutic activity, Neuromuscular re-education, Balance training, Gait training, Patient/Family education, Self Care, Joint mobilization, DME instructions, Dry Needling, Electrical stimulation, Spinal manipulation, Spinal mobilization, Cryotherapy, Moist heat, Ultrasound, Ionotophoresis 4mg /ml Dexamethasone, and Manual therapy.  PLAN FOR NEXT SESSION:    , PT, DPT 10:16 PM  01/07/22

## 2022-01-03 ENCOUNTER — Encounter: Payer: Self-pay | Admitting: Physical Therapy

## 2022-01-04 ENCOUNTER — Ambulatory Visit (INDEPENDENT_AMBULATORY_CARE_PROVIDER_SITE_OTHER): Payer: 59 | Admitting: Physical Therapy

## 2022-01-04 DIAGNOSIS — M5459 Other low back pain: Secondary | ICD-10-CM

## 2022-01-06 ENCOUNTER — Ambulatory Visit (INDEPENDENT_AMBULATORY_CARE_PROVIDER_SITE_OTHER): Payer: 59

## 2022-01-06 DIAGNOSIS — M4306 Spondylolysis, lumbar region: Secondary | ICD-10-CM

## 2022-01-07 ENCOUNTER — Encounter: Payer: Self-pay | Admitting: Physical Therapy

## 2022-01-07 NOTE — Therapy (Signed)
OUTPATIENT PHYSICAL THERAPY TREATMENT   Patient Name: Pamela Richards MRN: [id number] DOB:11/14/1985, 36 y.o., female Today's Date: 01/04/2022   PT End of Session - 01/07/22 2219     Visit Number 2    Number of Visits 16    Date for PT Re-Evaluation 02/12/22    Authorization Type [employer]    PT Start Time 0848    PT Stop Time 0929    PT Time Calculation (min) 41 min    Activity Tolerance Patient tolerated treatment well    Behavior During Therapy WFL for tasks assessed/performed              Past Medical History:  Diagnosis Date   Chicken pox    GAD (generalized anxiety disorder)    also history of depression now controlled. zoloft workedbest. Lexapro had to take same time every day or SE.    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   Kidney stones    [facility] prior   Past Surgical History:  Procedure Laterality Date   LITHOTRIPSY     URETERAL STENT PLACEMENT     x2   Patient Active Problem List   Diagnosis Date Noted   Acne 06/01/2016   GAD (generalized anxiety disorder)    Kidney stones     PCP: Garret Reddish  REFERRING PROVIDER: Lynne Leader  REFERRING DIAG: low back pain  Rationale for Evaluation and Treatment Rehabilitation  THERAPY DIAG:  Other low back pain  ONSET DATE: 2 weeks ago  SUBJECTIVE:                                                                                                                                                                                           SUBJECTIVE STATEMENT:  01/04/22: More pain in L back and into LE, continued pain into R as well.  No pain after visit.  May have done more sitting, as she is trying to work from home.     Eval: 2 weeks ago, pt had incident where she bent over when getting out of shower, had had significant pain in back.  Has had previous back pain, not similar.  Difficulty sleeping, muscle relaxer- helps with sleep.  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  Unable to drive.  Stairs at home: no.  Previous/recent imaging- pt states pars defect or fracture. Getting another MRI later this week.    PERTINENT HISTORY:     PAIN:  Are you having pain? Yes: NPRS scale: 7/10 Pain location: low back, down R LE, some tingling.  Pain description: achey,  Aggravating factors: sitting,  Relieving factors: none stated    PRECAUTIONS: Back     WEIGHT BEARING RESTRICTIONS No  FALLS:   Has patient fallen in last 6 months? No   PLOF: Independent  PATIENT GOALS   Decreased pain, return to regular movement/activity.    OBJECTIVE:   DIAGNOSTIC FINDINGS:     COGNITION:  Overall cognitive status: Within functional limits for tasks assessed   PALPATION: Mild tenderness in low lumbar region.    LUMBAR ROM:   Active  AROM  eval  Flexion Mild/mod limitation/pain  Extension Not tested   Right lateral flexion Mild limitation/pain  Left lateral flexion Mild limitation/pain  Right rotation   Left rotation    (Blank rows = not tested) Hip ROM: WFL     LOWER EXTREMITY MMT:    MMT Right eval Left eval  Hip flexion 4 4  Hip extension    Hip abduction 4 4  Hip adduction    Hip internal rotation 4 4  Hip external rotation 4 4  Knee flexion 5 5  Knee extension 5 5  Ankle dorsiflexion    Ankle plantarflexion    Ankle inversion    Ankle eversion     (Blank rows = not tested)   LUMBAR SPECIAL TESTS:  Not tested    TODAY'S TREATMENT   Therapeutic Exercise: Aerobic: Supine:  TA contraction x 10 with breathing; Clam ISO with GTB x 15 with TA;   Seated:  Standing: Stretches: SKTC 30 sec x 3 bil/manual assist  Neuromuscular Re-education: Manual Therapy: STM to bil low lumbar;  Therapeutic Activity: Self Care: on lumbar positioning for sleep and while working/sitting, lumbar positioning for ADLs.    PATIENT EDUCATION:  Education details: PT POC, Exam findings, Precautions for movement, HEP Person educated:  Patient Education method: Explanation, Demonstration, Tactile cues, Verbal cues, and Handouts Education comprehension: verbalized understanding, returned demonstration, verbal cues required, tactile cues required, and needs further education   HOME EXERCISE PROGRAM: Access Code: [email or web address]   ASSESSMENT:  CLINICAL IMPRESSION: Discussed posture and positioning for light flexion, with optimal mechanics for ADLs. Pt with soreness in low lumbar musculature with STM today. She is hesitant for too much movement or activity until upcoming MRI. She has some improved movement quality today for transfers and ther ex. Continued to review precautions, pt with good understanding.     OBJECTIVE IMPAIRMENTS decreased activity tolerance, decreased mobility, decreased strength, increased muscle spasms, and pain.   ACTIVITY LIMITATIONS carrying, lifting, bending, sitting, standing, squatting, stairs, bathing, and locomotion level  PARTICIPATION LIMITATIONS: cleaning, laundry, shopping, community activity, and yard work  PERSONAL FACTORS  none  are also affecting patient's functional outcome.   REHAB POTENTIAL: Good  CLINICAL DECISION MAKING: Stable/uncomplicated  EVALUATION COMPLEXITY: Low   GOALS: Goals reviewed with patient? Yes  SHORT TERM GOALS: Target date: 01/16/2022  Pt to be independent with initial HEP  Goal status: INITIAL  2.  Pt to demo ability for transfers with mechanics WFL, and minimal pain.   Goal status: INITIAL  3.  Pt to report ability to bend/flex for ADLS with optimal body mechanics and minimal pain.   Goal status: INITIAL    LONG TERM GOALS: Target date: 02/27/2022  Pt to be independent with final HEP  Goal status: INITIAL  2.Pt to report decreased pain in back and LEs to 0-2/10 with work duties and functional activity.   Goal status: INITIAL  3.  Pt to demo ability to bend/squat with optimal body mechanics, for improved ability for  IADLs.   Goal status:  INITIAL  4.  Pt to demo improved strength of hips and core to be at least 4+/5/ [facility] for pt age and dx.   Goal status: INITIAL       PLAN: PT FREQUENCY: 1-2x/week  PT DURATION: 8 weeks  PLANNED INTERVENTIONS: Therapeutic exercises, Therapeutic activity, Neuromuscular re-education, Balance training, Gait training, Patient/Family education, Self Care, Joint mobilization, DME instructions, Dry Needling, Electrical stimulation, Spinal manipulation, Spinal mobilization, Cryotherapy, Moist heat, Ultrasound, Ionotophoresis 4mg /ml Dexamethasone, and Manual therapy.  PLAN FOR NEXT SESSION:    , PT, DPT 10:20 PM  01/07/22

## 2022-01-08 ENCOUNTER — Ambulatory Visit (INDEPENDENT_AMBULATORY_CARE_PROVIDER_SITE_OTHER): Payer: 59 | Admitting: Physical Therapy

## 2022-01-08 ENCOUNTER — Encounter: Payer: Self-pay | Admitting: Physical Therapy

## 2022-01-08 DIAGNOSIS — M5459 Other low back pain: Secondary | ICD-10-CM | POA: Diagnosis not present

## 2022-01-08 NOTE — Therapy (Signed)
OUTPATIENT PHYSICAL THERAPY TREATMENT   Patient Name: Pamela Richards MRN: [id number] DOB:Nov 17, 1985, 36 y.o., female Today's Date: 01/08/2022   PT End of Session - 01/08/22 1013     Visit Number 3    Number of Visits 16    Date for PT Re-Evaluation 02/12/22    Authorization Type [employer]    PT Start Time [phone]    PT Stop Time 1012    PT Time Calculation (min) 36 min    Activity Tolerance Patient tolerated treatment well    Behavior During Therapy WFL for tasks assessed/performed               Past Medical History:  Diagnosis Date   Chicken pox    GAD (generalized anxiety disorder)    also history of depression now controlled. zoloft workedbest. Lexapro had to take same time every day or SE.    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   Kidney stones    [facility] prior   Past Surgical History:  Procedure Laterality Date   LITHOTRIPSY     URETERAL STENT PLACEMENT     x2   Patient Active Problem List   Diagnosis Date Noted   Acne 06/01/2016   GAD (generalized anxiety disorder)    Kidney stones     PCP: Tana Conch  REFERRING PROVIDER: Clementeen Graham  REFERRING DIAG: low back pain  Rationale for Evaluation and Treatment Rehabilitation  THERAPY DIAG:  Other low back pain  ONSET DATE: 2 weeks ago  SUBJECTIVE:                                                                                                                                                                                           SUBJECTIVE STATEMENT: Pt states soreness in mid back/bilaterally. Still has pain down L and R LE. R getting a bit better. Had new MRI.     Eval: 2 weeks ago, pt had incident where she bent over when getting out of shower, had had significant pain in back.  Has had previous back pain, not similar.  Difficulty sleeping, muscle relaxer- helps with sleep.  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  Unable to drive.  Stairs at home: no.   Previous/recent imaging- pt states pars defect or fracture. Getting another MRI later this week.    PERTINENT HISTORY:     PAIN:  Are you having pain? Yes: NPRS scale: 7/10 Pain location: low back, down R LE, some tingling.  Pain description: achey,  Aggravating factors: sitting,  Relieving factors: none stated    PRECAUTIONS: Back  WEIGHT BEARING RESTRICTIONS No  FALLS:   Has patient fallen in last 6 months? No   PLOF: Independent  PATIENT GOALS   Decreased pain, return to regular movement/activity.    OBJECTIVE:   DIAGNOSTIC FINDINGS:     COGNITION:  Overall cognitive status: Within functional limits for tasks assessed   PALPATION: Mild tenderness in low lumbar region.    LUMBAR ROM:   Active  AROM  eval  Flexion Mild/mod limitation/pain  Extension Not tested   Right lateral flexion Mild limitation/pain  Left lateral flexion Mild limitation/pain  Right rotation   Left rotation    (Blank rows = not tested) Hip ROM: WFL     LOWER EXTREMITY MMT:    MMT Right eval Left eval  Hip flexion 4 4  Hip extension    Hip abduction 4 4  Hip adduction    Hip internal rotation 4 4  Hip external rotation 4 4  Knee flexion 5 5  Knee extension 5 5  Ankle dorsiflexion    Ankle plantarflexion    Ankle inversion    Ankle eversion     (Blank rows = not tested)   LUMBAR SPECIAL TESTS:  Not tested    TODAY'S TREATMENT   01/08/22: Therapeutic Exercise: Aerobic: Supine:  TA contraction x 10 with breathing; TA with supine march x 10 ; Clam ISO with GTB x 15 with TA;   Seated:  Standing: Stretches: SKTC 30 sec x 3 bil/manual assist , pelvic tilts x 10;  Neuromuscular Re-education: Manual Therapy: STM to bil thoracic and lumbar paraspinals. ;  Therapeutic Activity: Self Care:    PATIENT EDUCATION:  Education details: Reviewed HEP, precautions.  Person educated: Patient Education method: Explanation, Demonstration, Tactile cues, Verbal cues, and  Handouts Education comprehension: verbalized understanding, returned demonstration, verbal cues required, tactile cues required, and needs further education   HOME EXERCISE PROGRAM: Access Code: [email or web address]   ASSESSMENT:  CLINICAL IMPRESSION: Pt will contact dr for clarification on new MRI, if pars fracture is new.She has most pain on R side of back, but still having pain into L LE. Discussed not sitting too long for work. Will progress activity, after MRI clarification, if she has more of a disc issue, will do more extension next visit.   OBJECTIVE IMPAIRMENTS decreased activity tolerance, decreased mobility, decreased strength, increased muscle spasms, and pain.   ACTIVITY LIMITATIONS carrying, lifting, bending, sitting, standing, squatting, stairs, bathing, and locomotion level  PARTICIPATION LIMITATIONS: cleaning, laundry, shopping, community activity, and yard work  PERSONAL FACTORS  none  are also affecting patient's functional outcome.   REHAB POTENTIAL: Good  CLINICAL DECISION MAKING: Stable/uncomplicated  EVALUATION COMPLEXITY: Low   GOALS: Goals reviewed with patient? Yes  SHORT TERM GOALS: Target date: 01/16/2022  Pt to be independent with initial HEP  Goal status: INITIAL  2.  Pt to demo ability for transfers with mechanics WFL, and minimal pain.   Goal status: INITIAL  3.  Pt to report ability to bend/flex for ADLS with optimal body mechanics and minimal pain.   Goal status: INITIAL    LONG TERM GOALS: Target date: 02/27/2022  Pt to be independent with final HEP  Goal status: INITIAL  2.Pt to report decreased pain in back and LEs to 0-2/10 with work duties and functional activity.   Goal status: INITIAL  3.  Pt to demo ability to bend/squat with optimal body mechanics, for improved ability for IADLs.   Goal status: INITIAL  4.  Pt to demo improved  strength of hips and core to be at least 4+/5/ [facility] for pt age and dx.   Goal status:  INITIAL       PLAN: PT FREQUENCY: 1-2x/week  PT DURATION: 8 weeks  PLANNED INTERVENTIONS: Therapeutic exercises, Therapeutic activity, Neuromuscular re-education, Balance training, Gait training, Patient/Family education, Self Care, Joint mobilization, DME instructions, Dry Needling, Electrical stimulation, Spinal manipulation, Spinal mobilization, Cryotherapy, Moist heat, Ultrasound, Ionotophoresis 4mg /ml Dexamethasone, and Manual therapy.  PLAN FOR NEXT SESSION:    , PT, DPT 2:55 PM  01/08/22

## 2022-01-09 ENCOUNTER — Telehealth: Payer: Self-pay | Admitting: Family Medicine

## 2022-01-09 DIAGNOSIS — M5441 Lumbago with sciatica, right side: Secondary | ICD-10-CM

## 2022-01-09 NOTE — Telephone Encounter (Signed)
Epidural steroid injection ordered 

## 2022-01-09 NOTE — Progress Notes (Signed)
Lumbar spine MRI shows a bulging disc pressing on the right L4 nerve root.  This is consistent with the symptoms you were experiencing in clinic. 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  This is seen on the CT scan. We will proceed to physical therapy as ordered as well as an epidural steroid injection.  Please contact [facility] at [phone] to schedule.  Let me know how you feel after the injection.

## 2022-01-11 ENCOUNTER — Ambulatory Visit (INDEPENDENT_AMBULATORY_CARE_PROVIDER_SITE_OTHER): Payer: 59 | Admitting: Physical Therapy

## 2022-01-11 ENCOUNTER — Encounter: Payer: Self-pay | Admitting: Physical Therapy

## 2022-01-11 DIAGNOSIS — M5459 Other low back pain: Secondary | ICD-10-CM | POA: Diagnosis not present

## 2022-01-11 NOTE — Therapy (Signed)
OUTPATIENT PHYSICAL THERAPY TREATMENT   Patient Name: Pamela Richards MRN: [id number] DOB:[birth year], 36 y.o., female Today's Date: 01/11/2022   PT End of Session - 01/11/22 1201     Visit Number 4    Number of Visits 16    Date for PT Re-Evaluation 02/12/22    Authorization Type [employer]    PT Start Time [id number]    PT Stop Time 1015    PT Time Calculation (min) 39 min    Activity Tolerance Patient tolerated treatment well    Behavior During Therapy WFL for tasks assessed/performed                Past Medical History:  Diagnosis Date   Chicken pox    GAD (generalized anxiety disorder)    also history of depression now controlled. zoloft workedbest. Lexapro had to take same time every day or SE.    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   Kidney stones    [facility] prior   Past Surgical History:  Procedure Laterality Date   LITHOTRIPSY     URETERAL STENT PLACEMENT     x2   Patient Active Problem List   Diagnosis Date Noted   Acne 06/01/2016   GAD (generalized anxiety disorder)    Kidney stones     PCP: Garret Reddish  REFERRING PROVIDER: Lynne Leader  REFERRING DIAG: low back pain  Rationale for Evaluation and Treatment Rehabilitation  THERAPY DIAG:  Other low back pain  ONSET DATE: 2 weeks ago  SUBJECTIVE:                                                                                                                                                                                           SUBJECTIVE STATEMENT: Pt states soreness in mid back/bilaterally. Still has pain down L and R LE.    Eval: 2 weeks ago, pt had incident where she bent over when getting out of shower, had had significant pain in back.  Has had previous back pain, not similar.  Difficulty sleeping, muscle relaxer- helps with sleep.  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  Stairs at home: no.  Previous/recent imaging- pt states pars  defect or fracture. Getting another MRI later this week.    PERTINENT HISTORY:     PAIN:  Are you having pain? Yes: NPRS scale: 7/10 Pain location: low back, down R LE, some tingling.  Pain description: achey,  Aggravating factors: sitting,  Relieving factors: none stated    PRECAUTIONS: Back     WEIGHT BEARING RESTRICTIONS No  FALLS:  Has patient fallen in last 6 months? No   PLOF: Independent  PATIENT GOALS   Decreased pain, return to regular movement/activity.    OBJECTIVE:   DIAGNOSTIC FINDINGS:     COGNITION:  Overall cognitive status: Within functional limits for tasks assessed   PALPATION: Mild tenderness in low lumbar region.    LUMBAR ROM:   Active  AROM  eval  Flexion Mild/mod limitation/pain  Extension Not tested   Right lateral flexion Mild limitation/pain  Left lateral flexion Mild limitation/pain  Right rotation   Left rotation    (Blank rows = not tested) Hip ROM: WFL     LOWER EXTREMITY MMT:    MMT Right eval Left eval  Hip flexion 4 4  Hip extension    Hip abduction 4 4  Hip adduction    Hip internal rotation 4 4  Hip external rotation 4 4  Knee flexion 5 5  Knee extension 5 5  Ankle dorsiflexion    Ankle plantarflexion    Ankle inversion    Ankle eversion     (Blank rows = not tested)   LUMBAR SPECIAL TESTS:  Not tested    TODAY'S TREATMENT   01/11/22: Therapeutic Exercise: Aerobic: Supine:  Clam ISO with GTB x 15 with TA;   Bridging x 8;   Seated: sit to stand x 10  Quadruped: TA x 5, TA with UE lifts x 10; Cat cow x 15(limited extension)   Standing: Stretches: Prone laying x 3 min, Prone on elbows x 2 min;  Neuromuscular Re-education: Manual Therapy: STM/IASTM  to bil thoracic and lumbar paraspinals. ;  Therapeutic Activity: Self Care:    PATIENT EDUCATION:  Education details: Reviewed HEP, precautions.  Person educated: Patient Education method: Explanation, Demonstration, Tactile cues, Verbal cues,  and Handouts Education comprehension: verbalized understanding, returned demonstration, verbal cues required, tactile cues required, and needs further education   HOME EXERCISE PROGRAM: Access Code: [email or web address]   ASSESSMENT:  CLINICAL IMPRESSION: Pt having injection on Monday. She has improving movement quality with transfers and gait. Pt to return next week after injection, will progress movement and strength as tolerated.   OBJECTIVE IMPAIRMENTS decreased activity tolerance, decreased mobility, decreased strength, increased muscle spasms, and pain.   ACTIVITY LIMITATIONS carrying, lifting, bending, sitting, standing, squatting, stairs, bathing, and locomotion level  PARTICIPATION LIMITATIONS: cleaning, laundry, shopping, community activity, and yard work  PERSONAL FACTORS  none  are also affecting patient's functional outcome.   REHAB POTENTIAL: Good  CLINICAL DECISION MAKING: Stable/uncomplicated  EVALUATION COMPLEXITY: Low   GOALS: Goals reviewed with patient? Yes  SHORT TERM GOALS: Target date: 01/16/2022  Pt to be independent with initial HEP  Goal status: INITIAL  2.  Pt to demo ability for transfers with mechanics WFL, and minimal pain.   Goal status: INITIAL  3.  Pt to report ability to bend/flex for ADLS with optimal body mechanics and minimal pain.   Goal status: INITIAL    LONG TERM GOALS: Target date: 02/27/2022  Pt to be independent with final HEP  Goal status: INITIAL  2.Pt to report decreased pain in back and LEs to 0-2/10 with work duties and functional activity.   Goal status: INITIAL  3.  Pt to demo ability to bend/squat with optimal body mechanics, for improved ability for IADLs.   Goal status: INITIAL  4.  Pt to demo improved strength of hips and core to be at least 4+/5/ [facility] for pt age and dx.   Goal status: INITIAL  PLAN: PT FREQUENCY: 1-2x/week  PT DURATION: 8 weeks  PLANNED INTERVENTIONS: Therapeutic exercises,  Therapeutic activity, Neuromuscular re-education, Balance training, Gait training, Patient/Family education, Self Care, Joint mobilization, DME instructions, Dry Needling, Electrical stimulation, Spinal manipulation, Spinal mobilization, Cryotherapy, Moist heat, Ultrasound, Ionotophoresis 4mg /ml Dexamethasone, and Manual therapy.  PLAN FOR NEXT SESSION:    , PT, DPT 12:02 PM  01/11/22

## 2022-01-15 ENCOUNTER — Ambulatory Visit
Admission: RE | Admit: 2022-01-15 | Discharge: 2022-01-15 | Disposition: A | Discharge status: Home / Self Care | Payer: 59 | Source: Ambulatory Visit | Attending: Family Medicine | Admitting: Family Medicine

## 2022-01-15 DIAGNOSIS — M5441 Lumbago with sciatica, right side: Secondary | ICD-10-CM

## 2022-01-15 MED ORDER — METHYLPREDNISOLONE ACETATE 40 MG/ML INJ SUSP (RADIOLOG
80.0000 mg | Freq: Once | INTRAMUSCULAR | Status: AC
  Administered 2022-01-15: 80 mg via EPIDURAL

## 2022-01-15 MED ORDER — IOPAMIDOL (ISOVUE-M 200) INJECTION 41%
1.0000 mL | Freq: Once | INTRAMUSCULAR | Status: AC
  Administered 2022-01-15: 1 mL via EPIDURAL

## 2022-01-15 NOTE — Discharge Instructions (Signed)

## 2022-01-16 ENCOUNTER — Encounter: Payer: 59 | Admitting: Physical Therapy

## 2022-01-19 ENCOUNTER — Encounter: Payer: Self-pay | Admitting: Physical Therapy

## 2022-01-19 ENCOUNTER — Ambulatory Visit (INDEPENDENT_AMBULATORY_CARE_PROVIDER_SITE_OTHER): Payer: 59 | Admitting: Physical Therapy

## 2022-01-19 DIAGNOSIS — M5459 Other low back pain: Secondary | ICD-10-CM

## 2022-01-19 NOTE — Therapy (Signed)
OUTPATIENT PHYSICAL THERAPY TREATMENT   Patient Name: Pamela Richards MRN: [id number] DOB:01/19/1986, 36 y.o., female Today's Date: 01/19/2022   PT End of Session - 01/19/22 0934     Visit Number 5    Number of Visits 16    Date for PT Re-Evaluation 02/12/22    Authorization Type [employer]    PT Start Time 0935    PT Stop Time 1015    PT Time Calculation (min) 40 min    Activity Tolerance Patient tolerated treatment well    Behavior During Therapy WFL for tasks assessed/performed                Past Medical History:  Diagnosis Date   Chicken pox    GAD (generalized anxiety disorder)    also history of depression now controlled. zoloft workedbest. Lexapro had to take same time every day or SE.    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   Kidney stones    [facility] prior   Past Surgical History:  Procedure Laterality Date   LITHOTRIPSY     URETERAL STENT PLACEMENT     x2   Patient Active Problem List   Diagnosis Date Noted   Acne 06/01/2016   GAD (generalized anxiety disorder)    Kidney stones     PCP: Tana Conch  REFERRING PROVIDER: Clementeen Graham  REFERRING DIAG: low back pain  Rationale for Evaluation and Treatment Rehabilitation  THERAPY DIAG:  Other low back pain  ONSET DATE: 2 weeks ago  SUBJECTIVE:                                                                                                                                                                                           SUBJECTIVE STATEMENT: Had injection earlier this week. She thinks pain is quite a bit better. Less pain in R side of back and down R leg. Feels she is moving better.    Eval: 2 weeks ago, pt had incident where she bent over when getting out of shower, had had significant pain in back.  Has had previous back pain, not similar.  Difficulty sleeping, muscle relaxer- helps with sleep.  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  Unable to  drive.  Stairs at home: no.  Previous/recent imaging- pt states pars defect or fracture. Getting another MRI later this week.    PERTINENT HISTORY:     PAIN:  Are you having pain? Yes: NPRS scale: 7/10 Pain location: low back, down R LE, some tingling.  Pain description: achey,  Aggravating factors: sitting,  Relieving factors: none stated  PRECAUTIONS: Back     WEIGHT BEARING RESTRICTIONS No  FALLS:   Has patient fallen in last 6 months? No   PLOF: Independent  PATIENT GOALS   Decreased pain, return to regular movement/activity.    OBJECTIVE:   DIAGNOSTIC FINDINGS:     COGNITION:  Overall cognitive status: Within functional limits for tasks assessed   PALPATION: Mild tenderness in low lumbar region.    LUMBAR ROM:   Active  AROM  eval  Flexion Mild/mod limitation/pain  Extension Not tested   Right lateral flexion Mild limitation/pain  Left lateral flexion Mild limitation/pain  Right rotation   Left rotation    (Blank rows = not tested) Hip ROM: WFL     LOWER EXTREMITY MMT:    MMT Right eval Left eval  Hip flexion 4 4  Hip extension    Hip abduction 4 4  Hip adduction    Hip internal rotation 4 4  Hip external rotation 4 4  Knee flexion 5 5  Knee extension 5 5  Ankle dorsiflexion    Ankle plantarflexion    Ankle inversion    Ankle eversion     (Blank rows = not tested)   LUMBAR SPECIAL TESTS:  Not tested    TODAY'S TREATMENT    01/19/22: Therapeutic Exercise: Aerobic: Recumbent bike L1 x 8 min;  Supine:  Bridging 2 x 10;  Seated: Quadruped:  Cat cow x 20;  Opp UE x 10; Opp LE x 10;   Standing: Rows GTB x 20; Low row RTB x 15, UE flexion RTB x 15 (for core); Education and practice for bend, squat, pick up object from floor x 5;  Stretches: Prone laying x 2 min, Prone on elbows x 2 min;  Neuromuscular Re-education: Manual Therapy:  Self Care:    01/11/22: Therapeutic Exercise: Aerobic: Supine:  Clam ISO with GTB x 15  with TA;   Bridging x 8;   Seated: sit to stand x 10  Quadruped: TA x 5, TA with UE lifts x 10; Cat cow x 15(limited extension)   Standing: Stretches: Prone laying x 3 min, Prone on elbows x 2 min;  Neuromuscular Re-education: Manual Therapy: STM/IASTM  to bil thoracic and lumbar paraspinals. ;  Therapeutic Activity: Self Care:    PATIENT EDUCATION:  Education details: Reviewed HEP,  Person educated: Patient Education method: Explanation, Demonstration, Tactile cues, Verbal cues, and Handouts Education comprehension: verbalized understanding, returned demonstration, verbal cues required, tactile cues required, and needs further education   HOME EXERCISE PROGRAM: Access Code: [email or web address]   ASSESSMENT:  CLINICAL IMPRESSION: Pt with good tolerance for activity today, with much less pain since injection. She is still somewhat guarded with movement, but improving. Discussed optimal form for bend/reach, lift for home activities. Plan to progress strengthening in neutral positions.   OBJECTIVE IMPAIRMENTS decreased activity tolerance, decreased mobility, decreased strength, increased muscle spasms, and pain.   ACTIVITY LIMITATIONS carrying, lifting, bending, sitting, standing, squatting, stairs, bathing, and locomotion level  PARTICIPATION LIMITATIONS: cleaning, laundry, shopping, community activity, and yard work  PERSONAL FACTORS  none  are also affecting patient's functional outcome.   REHAB POTENTIAL: Good  CLINICAL DECISION MAKING: Stable/uncomplicated  EVALUATION COMPLEXITY: Low   GOALS: Goals reviewed with patient? Yes  SHORT TERM GOALS: Target date: 01/16/2022  Pt to be independent with initial HEP  Goal status: INITIAL  2.  Pt to demo ability for transfers with mechanics WFL, and minimal pain.   Goal status: INITIAL  3.  Pt to  report ability to bend/flex for ADLS with optimal body mechanics and minimal pain.   Goal status: INITIAL    LONG TERM GOALS: Target  date: 02/27/2022  Pt to be independent with final HEP  Goal status: INITIAL  2.Pt to report decreased pain in back and LEs to 0-2/10 with work duties and functional activity.   Goal status: INITIAL  3.  Pt to demo ability to bend/squat with optimal body mechanics, for improved ability for IADLs.   Goal status: INITIAL  4.  Pt to demo improved strength of hips and core to be at least 4+/5/ [facility] for pt age and dx.   Goal status: INITIAL       PLAN: PT FREQUENCY: 1-2x/week  PT DURATION: 8 weeks  PLANNED INTERVENTIONS: Therapeutic exercises, Therapeutic activity, Neuromuscular re-education, Balance training, Gait training, Patient/Family education, Self Care, Joint mobilization, DME instructions, Dry Needling, Electrical stimulation, Spinal manipulation, Spinal mobilization, Cryotherapy, Moist heat, Ultrasound, Ionotophoresis 4mg /ml Dexamethasone, and Manual therapy.  PLAN FOR NEXT SESSION:    , PT, DPT 9:58 AM  01/19/22

## 2022-01-23 ENCOUNTER — Encounter: Payer: Self-pay | Admitting: Physical Therapy

## 2022-01-23 ENCOUNTER — Ambulatory Visit (INDEPENDENT_AMBULATORY_CARE_PROVIDER_SITE_OTHER): Payer: 59 | Admitting: Physical Therapy

## 2022-01-23 DIAGNOSIS — M5459 Other low back pain: Secondary | ICD-10-CM

## 2022-01-23 NOTE — Therapy (Signed)
OUTPATIENT PHYSICAL THERAPY TREATMENT   Patient Name: Pamela Richards MRN: [id number] DOB:Oct 29, 1985, 36 y.o., female Today's Date: 01/23/2022   PT End of Session - 01/23/22 0940     Visit Number 6    Number of Visits 16    Date for PT Re-Evaluation 02/12/22    Authorization Type [employer]    PT Start Time 0935    PT Stop Time 1015    PT Time Calculation (min) 40 min    Activity Tolerance Patient tolerated treatment well    Behavior During Therapy WFL for tasks assessed/performed                 Past Medical History:  Diagnosis Date   Chicken pox    GAD (generalized anxiety disorder)    also history of depression now controlled. zoloft workedbest. Lexapro had to take same time every day or SE.    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   Kidney stones    [facility] prior   Past Surgical History:  Procedure Laterality Date   LITHOTRIPSY     URETERAL STENT PLACEMENT     x2   Patient Active Problem List   Diagnosis Date Noted   Acne 06/01/2016   GAD (generalized anxiety disorder)    Kidney stones     PCP: Tana Conch  REFERRING PROVIDER: Clementeen Graham  REFERRING DIAG: low back pain  Rationale for Evaluation and Treatment Rehabilitation  THERAPY DIAG:  Other low back pain  ONSET DATE: 2 weeks ago  SUBJECTIVE:                                                                                                                                                                                           SUBJECTIVE STATEMENT: Pt still feeling improved pain since injection. Has mild soreness in R glute. Did have more soreness Sunday after doing a lot of walking and laundry.     Eval: 2 weeks ago, pt had incident where she bent over when getting out of shower, had had significant pain in back.  Has had previous back pain, not similar.  Difficulty sleeping, muscle relaxer- helps with sleep.  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   Unable to drive.  Stairs at home: no.  Previous/recent imaging- pt states pars defect or fracture. Getting another MRI later this week.    PERTINENT HISTORY:     PAIN:  Are you having pain? Yes: NPRS scale: 7/10 Pain location: low back, down R LE, some tingling.  Pain description: achey,  Aggravating factors: sitting,  Relieving factors: none stated  PRECAUTIONS: Back     WEIGHT BEARING RESTRICTIONS No  FALLS:   Has patient fallen in last 6 months? No   PLOF: Independent  PATIENT GOALS   Decreased pain, return to regular movement/activity.    OBJECTIVE:   DIAGNOSTIC FINDINGS:     COGNITION:  Overall cognitive status: Within functional limits for tasks assessed   PALPATION: Mild tenderness in low lumbar region.    LUMBAR ROM:   Active  AROM  eval  Flexion Mild/mod limitation/pain  Extension Not tested   Right lateral flexion Mild limitation/pain  Left lateral flexion Mild limitation/pain  Right rotation   Left rotation    (Blank rows = not tested) Hip ROM: WFL     LOWER EXTREMITY MMT:    MMT Right eval Left eval  Hip flexion 4 4  Hip extension    Hip abduction 4 4  Hip adduction    Hip internal rotation 4 4  Hip external rotation 4 4  Knee flexion 5 5  Knee extension 5 5  Ankle dorsiflexion    Ankle plantarflexion    Ankle inversion    Ankle eversion     (Blank rows = not tested)    TODAY'S TREATMENT   01/23/22: Therapeutic Exercise: Aerobic: Recumbent bike L1 x 8 min;  Supine:  Bridging  x 10; with ball squeeze x 10;  Seated:  Prone hip ext x 20 bil;  Quadruped:  Cat cow x 20;  Opp UE x 10; Opp LE x 10;   Standing: Rows GTB x 20;  Low row RTB /alternating x 15, Walk/march 10 ft x 2;  Hip abd 2x10 bil;  Stretches:  Neuromuscular Re-education: Manual Therapy: TPR to R glute min (S/L)  Self Care:      01/19/22: Therapeutic Exercise: Aerobic: Recumbent bike L1 x 8 min;  Supine:  Bridging 2 x 10;  Seated: Quadruped:   Cat cow x 20;  Opp UE x 10; Opp LE x 10;   Standing: Rows GTB x 20; Low row RTB x 15, UE flexion RTB x 15 (for core); Education and practice for bend, squat, pick up object from floor x 5;  Stretches: Prone laying x 2 min, Prone on elbows x 2 min;  Neuromuscular Re-education: Manual Therapy:  Self Care:   PATIENT EDUCATION:  Education details: Reviewed HEP,  Person educated: Patient Education method: Explanation, Demonstration, Tactile cues, Verbal cues, and Handouts Education comprehension: verbalized understanding, returned demonstration, verbal cues required, tactile cues required, and needs further education   HOME EXERCISE PROGRAM: Access Code: [email or web address]   ASSESSMENT:  CLINICAL IMPRESSION: Pt with soreness in R glute min today, addressed with manual, will assess effects next visit. She is overall moving much better. Focus on more standing and hip strength today, pt challenged with this, plan to progress as tolerated.   OBJECTIVE IMPAIRMENTS decreased activity tolerance, decreased mobility, decreased strength, increased muscle spasms, and pain.   ACTIVITY LIMITATIONS carrying, lifting, bending, sitting, standing, squatting, stairs, bathing, and locomotion level  PARTICIPATION LIMITATIONS: cleaning, laundry, shopping, community activity, and yard work  PERSONAL FACTORS  none  are also affecting patient's functional outcome.   REHAB POTENTIAL: Good  CLINICAL DECISION MAKING: Stable/uncomplicated  EVALUATION COMPLEXITY: Low   GOALS: Goals reviewed with patient? Yes  SHORT TERM GOALS: Target date: 01/16/2022  Pt to be independent with initial HEP  Goal status: INITIAL  2.  Pt to demo ability for transfers with mechanics WFL, and minimal pain.   Goal status: INITIAL  3.  Pt to report ability to bend/flex for ADLS with optimal body mechanics and minimal pain.   Goal status: INITIAL    LONG TERM GOALS: Target date: 02/27/2022  Pt to be independent with final  HEP  Goal status: INITIAL  2.Pt to report decreased pain in back and LEs to 0-2/10 with work duties and functional activity.   Goal status: INITIAL  3.  Pt to demo ability to bend/squat with optimal body mechanics, for improved ability for IADLs.   Goal status: INITIAL  4.  Pt to demo improved strength of hips and core to be at least 4+/5/ [facility] for pt age and dx.   Goal status: INITIAL       PLAN: PT FREQUENCY: 1-2x/week  PT DURATION: 8 weeks  PLANNED INTERVENTIONS: Therapeutic exercises, Therapeutic activity, Neuromuscular re-education, Balance training, Gait training, Patient/Family education, Self Care, Joint mobilization, DME instructions, Dry Needling, Electrical stimulation, Spinal manipulation, Spinal mobilization, Cryotherapy, Moist heat, Ultrasound, Ionotophoresis 4mg /ml Dexamethasone, and Manual therapy.  PLAN FOR NEXT SESSION:    , PT, DPT 1:28 PM  01/23/22

## 2022-01-26 ENCOUNTER — Ambulatory Visit (INDEPENDENT_AMBULATORY_CARE_PROVIDER_SITE_OTHER): Payer: 59 | Admitting: Physical Therapy

## 2022-01-26 ENCOUNTER — Encounter: Payer: Self-pay | Admitting: Physical Therapy

## 2022-01-26 DIAGNOSIS — M5459 Other low back pain: Secondary | ICD-10-CM

## 2022-01-26 NOTE — Therapy (Signed)
OUTPATIENT PHYSICAL THERAPY TREATMENT   Patient Name: Pamela Richards MRN: [id number] DOB:Jan 17, 1986, 36 y.o., female Today's Date: 01/26/2022   PT End of Session - 01/26/22 0932     Visit Number 7    Number of Visits 16    Date for PT Re-Evaluation 02/12/22    Authorization Type [employer]    PT Start Time 0933    PT Stop Time 1013    PT Time Calculation (min) 40 min    Activity Tolerance Patient tolerated treatment well    Behavior During Therapy WFL for tasks assessed/performed                 Past Medical History:  Diagnosis Date   Chicken pox    GAD (generalized anxiety disorder)    also history of depression now controlled. zoloft workedbest. Lexapro had to take same time every day or SE.    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   Kidney stones    [facility] prior   Past Surgical History:  Procedure Laterality Date   LITHOTRIPSY     URETERAL STENT PLACEMENT     x2   Patient Active Problem List   Diagnosis Date Noted   Acne 06/01/2016   GAD (generalized anxiety disorder)    Kidney stones     PCP: Garret Reddish  REFERRING PROVIDER: Lynne Leader  REFERRING DIAG: low back pain  Rationale for Evaluation and Treatment Rehabilitation  THERAPY DIAG:  Other low back pain  ONSET DATE: 2 weeks ago  SUBJECTIVE:                                                                                                                                                                                           SUBJECTIVE STATEMENT: Pt still feeling improved pain since injection. Some increased soreness by the end of the day.   Eval: 2 weeks ago, pt had incident where she bent over when getting out of shower, had had significant pain in back.  Has had previous back pain, not similar.  Difficulty sleeping, muscle relaxer- helps with sleep.  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  Stairs at home: no.  Previous/recent  imaging- pt states pars defect or fracture. Getting another MRI later this week.    PERTINENT HISTORY:     PAIN:  Are you having pain? Yes: NPRS scale: 3-5/10 Pain location: low back, down R LE, some tingling.  Pain description: achey,  Aggravating factors: sitting,  Relieving factors: none stated    PRECAUTIONS: Back     WEIGHT BEARING RESTRICTIONS No  FALLS:   Has patient fallen in last 6 months? No   PLOF: Independent  PATIENT GOALS   Decreased pain, return to regular movement/activity.    OBJECTIVE:   DIAGNOSTIC FINDINGS:     COGNITION:  Overall cognitive status: Within functional limits for tasks assessed   PALPATION: Mild tenderness in low lumbar region.    LUMBAR ROM:   Active  AROM  eval  Flexion Mild/mod limitation/pain  Extension Not tested   Right lateral flexion Mild limitation/pain  Left lateral flexion Mild limitation/pain  Right rotation   Left rotation    (Blank rows = not tested) Hip ROM: WFL     LOWER EXTREMITY MMT:    MMT Right eval Left eval  Hip flexion 4 4  Hip extension    Hip abduction 4 4  Hip adduction    Hip internal rotation 4 4  Hip external rotation 4 4  Knee flexion 5 5  Knee extension 5 5  Ankle dorsiflexion    Ankle plantarflexion    Ankle inversion    Ankle eversion     (Blank rows = not tested)    TODAY'S TREATMENT   01/26/22: Therapeutic Exercise: Aerobic: Recumbent bike L2 x 8 min;  Supine:  Bridging  x 10;  with ball squeeze x 10;  Seated:  Prone hip ext x 20 bil;  S/L clams x 15 on R;  Quadruped:  Cat cow x 20;  Opp UE x 15; Opp LE x 15;  Planks 15 sec x 3;  Standing: Rows GTB x 20;  Low row RTB /alternating x 15, Paloff press double GTB x 15 bil;  Stretches:  Neuromuscular Re-education: Manual Therapy:  Self Care:   01/23/22: Therapeutic Exercise: Aerobic: Recumbent bike L1 x 8 min;  Supine:  Bridging  x 10; with ball squeeze x 10;  Seated:  Prone hip ext x 20 bil;  Quadruped:   Cat cow x 20;  Opp UE x 10; Opp LE x 10;   Standing: Rows GTB x 20;  Low row RTB /alternating x 15, Walk/march 10 ft x 2;  Hip abd 2x10 bil;  Stretches:  Neuromuscular Re-education: Manual Therapy: TPR to R glute min (S/L)  Self Care:      01/19/22: Therapeutic Exercise: Aerobic: Recumbent bike L1 x 8 min;  Supine:  Bridging 2 x 10;  Seated: Quadruped:  Cat cow x 20;  Opp UE x 10; Opp LE x 10;   Standing: Rows GTB x 20; Low row RTB x 15, UE flexion RTB x 15 (for core); Education and practice for bend, squat, pick up object from floor x 5;  Stretches: Prone laying x 2 min, Prone on elbows x 2 min;  Neuromuscular Re-education: Manual Therapy:  Self Care:   PATIENT EDUCATION:  Education details: Reviewed HEP,  Person educated: Patient Education method: Explanation, Demonstration, Tactile cues, Verbal cues, and Handouts Education comprehension: verbalized understanding, returned demonstration, verbal cues required, tactile cues required, and needs further education   HOME EXERCISE PROGRAM: Access Code: [email or web address]   ASSESSMENT:  CLINICAL IMPRESSION: Pt with improved ability for lumbar flexion- wnl, today with much improved pain. Also improved ability for squat motion for IADLs. She is doing well with core strengthening. Overall progressing very well. Will benefit from continued strength/stabilizatoin. Plan to progress standing strength and ability for functional bend/squat as tolerated.   OBJECTIVE IMPAIRMENTS decreased activity tolerance, decreased mobility, decreased strength, increased muscle spasms, and pain.   ACTIVITY LIMITATIONS carrying, lifting, bending, sitting, standing,  squatting, stairs, bathing, and locomotion level  PARTICIPATION LIMITATIONS: cleaning, laundry, shopping, community activity, and yard work  PERSONAL FACTORS  none  are also affecting patient's functional outcome.   REHAB POTENTIAL: Good  CLINICAL DECISION MAKING: Stable/uncomplicated  EVALUATION  COMPLEXITY: Low   GOALS: Goals reviewed with patient? Yes  SHORT TERM GOALS: Target date: 01/16/2022  Pt to be independent with initial HEP  Goal status: MET  2.  Pt to demo ability for transfers with mechanics WFL, and minimal pain.   Goal status: MET  3.  Pt to report ability to bend/flex for ADLS with optimal body mechanics and minimal pain.   Goal status: MET    LONG TERM GOALS: Target date: 02/27/2022  Pt to be independent with final HEP  Goal status: INITIAL  2.Pt to report decreased pain in back and LEs to 0-2/10 with work duties and functional activity.   Goal status: INITIAL  3.  Pt to demo ability to bend/squat with optimal body mechanics, for improved ability for IADLs.   Goal status: INITIAL  4.  Pt to demo improved strength of hips and core to be at least 4+/5/ [facility] for pt age and dx.   Goal status: INITIAL       PLAN: PT FREQUENCY: 1-2x/week  PT DURATION: 8 weeks  PLANNED INTERVENTIONS: Therapeutic exercises, Therapeutic activity, Neuromuscular re-education, Balance training, Gait training, Patient/Family education, Self Care, Joint mobilization, DME instructions, Dry Needling, Electrical stimulation, Spinal manipulation, Spinal mobilization, Cryotherapy, Moist heat, Ultrasound, Ionotophoresis 11m/ml Dexamethasone, and Manual therapy.  PLAN FOR NEXT SESSION:    LLyndee Hensen PT, DPT 9:32 AM  01/26/22

## 2022-01-31 ENCOUNTER — Telehealth: Payer: Self-pay | Admitting: Family Medicine

## 2022-01-31 NOTE — Telephone Encounter (Signed)
Vml for pt to call back and sch PT with lauren for 8/24- lucia

## 2022-02-05 NOTE — Progress Notes (Unsigned)
   I, Philbert Riser, LAT, ATC acting as a scribe for Clementeen Graham, MD.  Pamela Richards is a 36 y.o. female who presents to [facility] today for f/u LBP w/ pain radiating down the right leg in an L3 or L4 dermatomal pattern and MRI review. Pt injured her back when she went to dry her legs off after getting out of the shower. Pt was last seen by Dr. Denyse Amass on 12/29/21 and she was referred to PT, completing 7 visits, and a MRI was ordered. Pt had a CT scan obtained in the outside hospital, in [place], [place], which reportedly showed concern for a right L4-5 pars defects or pars fracture and we requested those records. Based on the MRI results, a lumbar ESI was ordered. Today, pt reports    Pertinent review of systems: ***  Relevant historical information: ***   Exam:  There were no vitals taken for this visit. General: Well Developed, well nourished, and in no acute distress.   MSK: ***    Lab and Radiology Results No results found for this or any previous visit (from the past 72 hour(s)). No results found.     Assessment and Plan: 36 y.o. female with ***   PDMP not reviewed this encounter. No orders of the defined types were placed in this encounter.  No orders of the defined types were placed in this encounter.    Discussed warning signs or symptoms. Please see discharge instructions. Patient expresses understanding.   ***

## 2022-02-06 ENCOUNTER — Ambulatory Visit (INDEPENDENT_AMBULATORY_CARE_PROVIDER_SITE_OTHER): Payer: 59 | Admitting: Family Medicine

## 2022-02-06 ENCOUNTER — Ambulatory Visit (INDEPENDENT_AMBULATORY_CARE_PROVIDER_SITE_OTHER): Payer: 59 | Admitting: Physical Therapy

## 2022-02-06 ENCOUNTER — Encounter: Payer: Self-pay | Admitting: Physical Therapy

## 2022-02-06 VITALS — BP 116/80 | HR 75 | Ht 63.0 in | Wt 145.2 lb

## 2022-02-06 DIAGNOSIS — M4306 Spondylolysis, lumbar region: Secondary | ICD-10-CM

## 2022-02-06 DIAGNOSIS — M5459 Other low back pain: Secondary | ICD-10-CM

## 2022-02-06 DIAGNOSIS — M5441 Lumbago with sciatica, right side: Secondary | ICD-10-CM

## 2022-02-06 MED ORDER — TRAMADOL HCL 50 MG PO TABS: 50.0000 mg | ORAL_TABLET | Freq: Three times a day (TID) | ORAL | 0 refills | Status: DC | PRN

## 2022-02-06 MED ORDER — PREDNISONE 50 MG PO TABS: 50.0000 mg | ORAL_TABLET | Freq: Every day | ORAL | 0 refills | Status: DC

## 2022-02-06 NOTE — Patient Instructions (Signed)
Thank you for coming in today.   Continue PT   Let me know if you need a new injection order.   I have prescribed prednisone and tramadol as emergency backup medicine for travel.   Recheck with me as needed.

## 2022-02-06 NOTE — Therapy (Signed)
OUTPATIENT PHYSICAL THERAPY TREATMENT   Patient Name: Pamela Richards MRN: [id number] DOB:Apr 16, 1986, 36 y.o., female Today's Date: 02/06/2022   PT End of Session - 02/06/22 1315     Visit Number 8    Number of Visits 16    Date for PT Re-Evaluation 02/12/22    Authorization Type [employer]    PT Start Time 1305    PT Stop Time 1345    PT Time Calculation (min) 40 min    Activity Tolerance Patient tolerated treatment well    Behavior During Therapy WFL for tasks assessed/performed                 Past Medical History:  Diagnosis Date   Chicken pox    GAD (generalized anxiety disorder)    also history of depression now controlled. zoloft workedbest. Lexapro had to take same time every day or SE.    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 Still having mild pain. Most pain with sitting/driving.   Eval: 2 weeks ago, pt had incident where she bent over when getting out of shower, had had significant pain in back.  Has had previous back pain, not similar.  Difficulty sleeping, muscle relaxer- helps with sleep.  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  Stairs at home: no.   Previous/recent imaging- pt states pars defect or fracture. Getting another MRI later this week.    PERTINENT HISTORY:     PAIN:  Are you having pain? Yes: NPRS scale: 3-5/10 Pain location: low back, down R LE, some tingling.  Pain description: achey,  Aggravating factors: sitting,  Relieving factors: none stated    PRECAUTIONS: Back     WEIGHT  BEARING RESTRICTIONS No  FALLS:   Has patient fallen in last 6 months? No   PLOF: Independent  PATIENT GOALS   Decreased pain, return to regular movement/activity.    OBJECTIVE:   DIAGNOSTIC FINDINGS:     COGNITION:  Overall cognitive status: Within functional limits for tasks assessed   PALPATION: Mild tenderness in low lumbar region.    LUMBAR ROM:   Active  AROM  eval  Flexion Mild/mod limitation/pain  Extension Not tested   Right lateral flexion Mild limitation/pain  Left lateral flexion Mild limitation/pain  Right rotation   Left rotation    (Blank rows = not tested) Hip ROM: WFL     LOWER EXTREMITY MMT:    MMT Right eval Left eval  Hip flexion 4 4  Hip extension    Hip abduction 4 4  Hip adduction    Hip internal rotation 4 4  Hip external rotation 4 4  Knee flexion 5 5  Knee extension 5 5  Ankle dorsiflexion    Ankle plantarflexion    Ankle inversion    Ankle eversion     (Blank rows = not tested)    TODAY'S TREATMENT   02/06/22 Therapeutic Exercise: Aerobic: Recumbent bike L2 x 8 min;  Supine:  Bridging  x 20;    Seated:  Prone : press ups x 20;  Quadruped:   Planks 20 sec x 5;  Standing: Rows GTB x 20;   Paloff press double blueTB x 15 bil;  Mini squat x 5, regular squat x 10;  Squat/lift 15 lb KB x 8 bil;  Stretches: seated piriformis 30 sec x 3 bil;  Neuromuscular Re-education: Manual Therapy:  Self Care:   01/26/22: Therapeutic Exercise: Aerobic: Recumbent bike L2 x 8 min;  Supine:  Bridging  x 10;  with ball squeeze x 10;  Seated:  Prone hip ext x 20 bil;  S/L  clams x 15 on R;  Quadruped:  Cat cow x 20;  Opp UE x 15; Opp LE x 15;  Planks 15 sec x 3;  Standing: Rows GTB x 20;  Low row RTB /alternating x 15, Paloff press double GTB x 15 bil;  Stretches:  Neuromuscular Re-education: Manual Therapy:  Self Care:    PATIENT EDUCATION:  Education details: updated and Reviewed HEP,  Person educated: Patient Education method: Explanation, Demonstration, Tactile cues, Verbal cues, and Handouts Education comprehension: verbalized understanding, returned demonstration, verbal cues required, tactile cues required, and needs further education   HOME EXERCISE PROGRAM: Access Code: [email or web address]   ASSESSMENT:  CLINICAL IMPRESSION: Pt progressing well. She has good ability for gentile press ups today without pain. She did very well with squat/lift mechanics without pain. She continues to have increased soreness with sitting/driving-discussed altering seat position as able.   OBJECTIVE IMPAIRMENTS decreased activity tolerance, decreased mobility, decreased strength, increased muscle spasms, and pain.   ACTIVITY LIMITATIONS carrying, lifting, bending, sitting, standing, squatting, stairs, bathing, and locomotion level  PARTICIPATION LIMITATIONS: cleaning, laundry, shopping, community activity, and yard work  PERSONAL FACTORS  none  are also affecting patient's functional outcome.   REHAB POTENTIAL: Good  CLINICAL DECISION MAKING: Stable/uncomplicated  EVALUATION COMPLEXITY: Low   GOALS: Goals reviewed with patient? Yes  SHORT TERM GOALS: Target date: 01/16/2022  Pt to be independent with initial HEP  Goal status: MET  2.  Pt to demo ability for transfers with mechanics WFL, and minimal pain.   Goal status: MET  3.  Pt to report ability to bend/flex  for ADLS with optimal body mechanics and minimal pain.   Goal status: MET    LONG TERM GOALS: Target date: 02/27/2022  Pt to be independent with final HEP  Goal status: INITIAL  2.Pt to  report decreased pain in back and LEs to 0-2/10 with work duties and functional activity.   Goal status: INITIAL  3.  Pt to demo ability to bend/squat with optimal body mechanics, for improved ability for IADLs.   Goal status: INITIAL  4.  Pt to demo improved strength of hips and core to be at least 4+/5/ [facility] for pt age and dx.   Goal status: INITIAL       PLAN: PT FREQUENCY: 1-2x/week  PT DURATION: 8 weeks  PLANNED INTERVENTIONS: Therapeutic exercises, Therapeutic activity, Neuromuscular re-education, Balance training, Gait training, Patient/Family education, Self Care, Joint mobilization, DME instructions, Dry Needling, Electrical stimulation, Spinal manipulation, Spinal mobilization, Cryotherapy, Moist heat, Ultrasound, Ionotophoresis 51m/ml Dexamethasone, and Manual therapy.  PLAN FOR NEXT SESSION:    LLyndee Hensen PT, DPT 1:52 PM  02/06/22

## 2022-02-09 ENCOUNTER — Encounter: Payer: Self-pay | Admitting: Family Medicine

## 2022-02-19 NOTE — Therapy (Signed)
OUTPATIENT PHYSICAL THERAPY TREATMENT   Patient Name: Pamela Richards MRN: [id number] DOB:Nov 26, 1985, 36 y.o., female Today's Date: 02/06/2022   PT End of Session - 02/23/22 1016     Visit Number 9    Number of Visits 16    Date for PT Re-Evaluation 02/12/22    Authorization Type [employer]    PT Start Time 0933    PT Stop Time 1015    PT Time Calculation (min) 42 min    Activity Tolerance Patient tolerated treatment well    Behavior During Therapy WFL for tasks assessed/performed                  Past Medical History:  Diagnosis Date   Chicken pox    GAD (generalized anxiety disorder)    also history of depression now controlled. zoloft workedbest. Lexapro had to take same time every day or SE.    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 She reports that she has an increase in sharp pain in her L hip that she thinks may have flared up from all the sitting, walking and traveling.   Eval: 2 weeks ago, pt had incident where she bent over when getting out of shower, had had significant pain in back.  Has had previous back pain, not  similar.  Difficulty sleeping, muscle relaxer- helps with sleep.  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  Stairs at home: no.  Previous/recent imaging- pt states pars defect or fracture. Getting another MRI later this week.    PERTINENT HISTORY:     PAIN:  Are you having pain? Yes: NPRS  scale: 3-5/10 Pain location: low back, down R LE, some tingling.  Pain description: achey,  Aggravating factors: sitting,  Relieving factors: none stated    PRECAUTIONS: Back     WEIGHT BEARING RESTRICTIONS No  FALLS:   Has patient fallen in last 6 months? No   PLOF: Independent  PATIENT GOALS   Decreased pain, return to regular movement/activity.    OBJECTIVE:   DIAGNOSTIC FINDINGS:     COGNITION:  Overall cognitive status: Within functional limits for tasks assessed   PALPATION: Mild tenderness in low lumbar region.    LUMBAR ROM:   Active  AROM  eval  Flexion Mild/mod limitation/pain  Extension Not tested   Right lateral flexion Mild limitation/pain  Left lateral flexion Mild limitation/pain  Right rotation   Left rotation    (Blank rows = not tested) Hip ROM: WFL     LOWER EXTREMITY MMT:    MMT Right eval Left eval  Hip flexion 4 4  Hip extension    Hip abduction 4 4  Hip adduction    Hip internal rotation 4 4  Hip external rotation 4 4  Knee flexion 5 5  Knee extension 5 5  Ankle dorsiflexion    Ankle plantarflexion    Ankle inversion    Ankle eversion     (Blank rows = not tested)    TODAY'S TREATMENT  [facility] Adult PT Treatment:                                                DATE: 02/23/2022 Therapeutic Exercise: Recumbent bike L2 x 8 min Bridges x20  LTR x30  PPT x30  Supine clams GTB x20  Piriformis stretch 4x 30 sec  02/06/22 Therapeutic Exercise: Aerobic: Recumbent bike L2 x 8 min;  Supine:  Bridging  x 20;    Seated:  Prone : press ups x 20;  Quadruped:   Planks 20 sec x 5;  Standing: Rows GTB x  20;   Paloff press double blueTB x 15 bil;  Mini squat x 5, regular squat x 10;  Squat/lift 15 lb KB x 8 bil;  Stretches: seated piriformis 30 sec x 3 bil;  Neuromuscular Re-education: Manual Therapy:  Self Care:   01/26/22: Therapeutic Exercise: Aerobic: Recumbent bike L2 x 8 min;  Supine:  Bridging  x 10;  with ball squeeze x 10;  Seated:  Prone hip ext x 20 bil;  S/L clams x 15 on R;  Quadruped:  Cat cow x 20;  Opp UE x 15; Opp LE x 15;  Planks 15 sec x 3;  Standing: Rows GTB x 20;  Low row RTB /alternating x 15, Paloff press double GTB x 15 bil;  Stretches:  Neuromuscular Re-education: Manual Therapy:  Self Care:    PATIENT EDUCATION:  Education details: updated and Reviewed HEP,  Person educated: Patient Education method: Explanation, Demonstration, Tactile cues, Verbal cues, and Handouts Education comprehension: verbalized understanding, returned demonstration, verbal cues required, tactile cues required, and needs further education   HOME EXERCISE PROGRAM: Access Code: [email or web address]   ASSESSMENT:  CLINICAL IMPRESSION: Pt presents to PT after a hiatus due to traveling for work. She reports a sharp pain in her hip. Session with focus on gentle mobility and proximal hip strengthening. Pt reports increased tension in her pelvis that eased up as she continued with PPT and  lumbar rotations. Pt tolerated all exercises well today. At the end of the session upon sitting, pt reports a sharp pain in her SI joint. Plan to further investigate SI joint next session if pain continues. Pt will continue to benefit from skilled PT to address continued deficits.    OBJECTIVE IMPAIRMENTS decreased activity tolerance, decreased mobility, decreased strength, increased muscle spasms, and pain.   ACTIVITY LIMITATIONS carrying, lifting, bending, sitting, standing, squatting, stairs, bathing, and locomotion level  PARTICIPATION LIMITATIONS: cleaning, laundry, shopping, community activity, and yard  work  PERSONAL FACTORS  none  are also affecting patient's functional outcome.   REHAB POTENTIAL: Good  CLINICAL DECISION MAKING: Stable/uncomplicated  EVALUATION COMPLEXITY: Low   GOALS: Goals reviewed with patient? Yes  SHORT TERM GOALS: Target date: 01/16/2022  Pt to be independent with initial HEP  Goal status: MET  2.  Pt to demo ability for transfers with mechanics WFL, and minimal pain.   Goal status: MET  3.  Pt to report ability to bend/flex for ADLS with optimal body mechanics and minimal pain.   Goal status: MET    LONG TERM GOALS: Target date: 02/27/2022  Pt to be independent with final HEP  Goal status: INITIAL  2.Pt to report decreased pain in back and LEs to 0-2/10 with work duties and functional activity.   Goal status: INITIAL  3.  Pt to demo ability to bend/squat with optimal body mechanics, for improved ability for IADLs.   Goal status: INITIAL  4.  Pt to demo improved strength of hips and core to be at least 4+/5/ [facility] for pt age and dx.   Goal status: INITIAL       PLAN: PT FREQUENCY: 1-2x/week  PT DURATION: 8 weeks  PLANNED INTERVENTIONS: Therapeutic exercises, Therapeutic activity, Neuromuscular re-education, Balance training, Gait training, Patient/Family education, Self Care, Joint mobilization, DME instructions, Dry Needling, Electrical stimulation, Spinal manipulation, Spinal mobilization, Cryotherapy, Moist heat, Ultrasound, Ionotophoresis 28m/ml Dexamethasone, and Manual therapy.  PLAN FOR NEXT SESSION:   SRudi HeapPT, DPT 02/23/22  10:17 AM

## 2022-02-23 ENCOUNTER — Ambulatory Visit (INDEPENDENT_AMBULATORY_CARE_PROVIDER_SITE_OTHER): Payer: 59 | Admitting: Physical Therapy

## 2022-02-23 ENCOUNTER — Encounter: Payer: Self-pay | Admitting: Physical Therapy

## 2022-02-23 DIAGNOSIS — M5459 Other low back pain: Secondary | ICD-10-CM

## 2022-02-27 ENCOUNTER — Ambulatory Visit (INDEPENDENT_AMBULATORY_CARE_PROVIDER_SITE_OTHER): Payer: 59 | Admitting: Physical Therapy

## 2022-02-27 ENCOUNTER — Encounter: Payer: Self-pay | Admitting: Physical Therapy

## 2022-02-27 DIAGNOSIS — M5459 Other low back pain: Secondary | ICD-10-CM

## 2022-02-27 NOTE — Therapy (Signed)
OUTPATIENT PHYSICAL THERAPY TREATMENT/Re-Cert    Patient Name: Pamela Richards MRN: [id number] DOB:19-Feb-1986, 36 y.o., female Today's Date: 02/27/2022   PT End of Session - 02/27/22 1151     Visit Number 10    Number of Visits 16    Date for PT Re-Evaluation 04/10/22    Authorization Type [employer]    PT Start Time 1108    PT Stop Time 1146    PT Time Calculation (min) 38 min    Activity Tolerance Patient tolerated treatment well    Behavior During Therapy WFL for tasks assessed/performed                   Past Medical History:  Diagnosis Date   Chicken pox    GAD (generalized anxiety disorder)    also history of depression now controlled. zoloft workedbest. Lexapro had to take same time every day or SE.    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 Back and leg pain doing ok, still getting very mild LE symptoms with prolonged sitting.   Eval: 2 weeks ago, pt had incident where she bent over when getting out of shower, had had significant pain in back.  Has had previous back pain, not similar.  Difficulty sleeping, muscle relaxer- helps  with sleep.  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  Stairs at home: no.  Previous/recent imaging- pt states pars defect or fracture. Getting another MRI later this week.    PERTINENT HISTORY:     PAIN:  Are you having pain? Yes: NPRS scale: 3-5/10 Pain location: low back, down R LE, some tingling.  Pain description: Dannial Monarch,  Aggravating factors: sitting,  Relieving factors: none stated    PRECAUTIONS: Back     WEIGHT BEARING RESTRICTIONS No  FALLS:   Has patient fallen in last 6 months? No   PLOF: Independent  PATIENT GOALS   Decreased pain, return to regular movement/activity.    OBJECTIVE: updated 02/27/22   DIAGNOSTIC FINDINGS:     COGNITION:  Overall cognitive status: Within functional limits for tasks assessed   PALPATION: Mild tenderness in low lumbar region.    LUMBAR ROM:   Active  AROM  eval  Flexion Mild limitation  Extension WFL/not tested to end range  Right lateral flexion WFL  Left lateral flexion WFL  Right rotation   Left rotation    (Blank rows = not tested) Hip ROM: WFL     LOWER EXTREMITY MMT:    MMT Right eval Left eval  Hip flexion 4+ 4+  Hip extension    Hip abduction 4+ 4+  Hip adduction    Hip internal rotation 4+ 4+  Hip external rotation 4+ 4+  Knee flexion 5 5  Knee extension 5 5  Ankle dorsiflexion    Ankle plantarflexion    Ankle inversion    Ankle eversion     (Blank rows = not tested)    TODAY'S TREATMENT   02/27/2022 Therapeutic Exercise: Aerobic: Recumbent bike L2 x 8 min;  Supine:  Bridging  x 20;    Seated:  Prone :  Quadruped:    Standing: Rows BlueTB x 20;   Paloff press double blueTB x 15 bil;  Band walks Blue TB 20 ft x 6;  Lumbar extension x 10 (education on form and use)  Stretches: supine piriformis 30 sec x 3 bil; hip ER fallouts x 20;     [facility] Adult PT Treatment:                                                DATE: 02/23/2022 Therapeutic  Exercise: Recumbent bike L2 x 8 min Bridges x20  LTR x30  PPT x30  Supine clams GTB x20  Piriformis stretch 4x 30 sec  02/06/22 Therapeutic Exercise: Aerobic: Recumbent bike L2 x 8 min;  Supine:  Bridging  x 20;    Seated:  Prone : press ups x 20;  Quadruped:   Planks 20 sec x 5;  Standing: Rows GTB x 20;   Paloff press double blueTB x 15 bil;  Mini squat x 5, regular squat x 10;  Squat/lift 15 lb KB x 8 bil;  Stretches: seated piriformis 30 sec x 3 bil;  Neuromuscular Re-education: Manual Therapy:  Self Care:    PATIENT EDUCATION:  Education details: updated and Reviewed HEP,  Person educated: Patient Education method: Explanation, Demonstration, Tactile cues, Verbal cues, and Handouts Education comprehension: verbalized understanding, returned demonstration, verbal cues required, tactile cues required, and needs further education   HOME EXERCISE PROGRAM: Access Code: [email or web address]   ASSESSMENT:  CLINICAL IMPRESSION: Re-Cert:  Pt making good progress with back and LE pain. She has much improved ability for ROM and functional activity. She is still having some pain, only mild LE symptoms with prolonged sitting. Discussed continuing to vary standing/sitting position during the work day, to continue to improve this. Also discussed (light) extension in standing today as trial for reducing LE symptoms as needed. Discussed precautions for pars defect. Pt having soreness  in coccyx region with sitting. No pain to palpate top of coccyx today or surrounding glute musculature. Will continue to monitor. Pt making good progress, but also now having new soreness. Pt to benefit from continued care, to improve remaining pain and deficits, and to meet LTGs.   OBJECTIVE IMPAIRMENTS decreased activity tolerance, decreased mobility, decreased strength, increased muscle spasms, and pain.   ACTIVITY LIMITATIONS carrying, lifting, bending, sitting, standing, squatting, stairs, bathing, and locomotion  level  PARTICIPATION LIMITATIONS: cleaning, laundry, shopping, community activity, and yard work  PERSONAL FACTORS  none  are also affecting patient's functional outcome.   REHAB POTENTIAL: Good  CLINICAL DECISION MAKING: Stable/uncomplicated  EVALUATION COMPLEXITY: Low   GOALS: Goals reviewed with patient? Yes  SHORT TERM GOALS: Target date: 01/16/2022  Pt to be independent with initial HEP  Goal status: MET  2.  Pt to demo ability for transfers with mechanics WFL, and minimal pain.   Goal status: MET  3.  Pt to report ability to bend/flex for ADLS with optimal body mechanics and minimal pain.   Goal status: MET    LONG TERM GOALS: Target date: 04/10/2022  Pt to be independent with final HEP  Goal status: IN PROGRESS  2.Pt to report decreased pain in back and LEs to 0-2/10 with work duties and functional activity.   Goal status: IN PROGRESS  3.  Pt to demo ability to bend/squat with optimal body mechanics, for improved ability for IADLs.   Goal status: MET  4.  Pt to demo improved strength of hips and core to be at least 4+/5/ [facility] for pt age and dx.   Goal status: MET       PLAN: PT FREQUENCY: 1-2x/week  PT DURATION: 8 weeks  PLANNED INTERVENTIONS: Therapeutic exercises, Therapeutic activity, Neuromuscular re-education, Balance training, Gait training, Patient/Family education, Self Care, Joint mobilization, DME instructions, Dry Needling, Electrical stimulation, Spinal manipulation, Spinal mobilization, Cryotherapy, Moist heat, Ultrasound, Ionotophoresis 16m/ml Dexamethasone, and Manual therapy.  PLAN FOR NEXT SESSION:   LLyndee Hensen PT, DPT 12:02 PM  02/27/22

## 2022-03-05 ENCOUNTER — Encounter: Payer: Self-pay | Admitting: *Deleted

## 2022-03-06 ENCOUNTER — Encounter: Payer: Self-pay | Admitting: Physical Therapy

## 2022-03-06 ENCOUNTER — Ambulatory Visit (INDEPENDENT_AMBULATORY_CARE_PROVIDER_SITE_OTHER): Payer: 59 | Admitting: Physical Therapy

## 2022-03-06 DIAGNOSIS — M5459 Other low back pain: Secondary | ICD-10-CM | POA: Diagnosis not present

## 2022-03-06 NOTE — Therapy (Signed)
OUTPATIENT PHYSICAL THERAPY TREATMENT/Discharge   Patient Name: Pamela Richards MRN: [id number] DOB:05-17-1986, 36 y.o., female Today's Date: 03/06/2022   PT End of Session - 03/06/22 0935     Visit Number 11    Number of Visits 16    Date for PT Re-Evaluation 04/10/22    Authorization Type [employer]    PT Start Time [phone]    PT Stop Time 1016    PT Time Calculation (min) 38 min    Activity Tolerance Patient tolerated treatment well    Behavior During Therapy WFL for tasks assessed/performed                   Past Medical History:  Diagnosis Date   Chicken pox    GAD (generalized anxiety disorder)    also history of depression now controlled. zoloft workedbest. Lexapro had to take same time every day or SE.    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 Has been able to sit comfortably in chair for that. Overall doing well, feels some stiffness when she sits for a longer time, and with initial standing. Has had no back pain or radicular pain   Eval: 2 weeks ago, pt had incident where she bent over when getting out of shower, had had significant pain in back.  Has had  previous back pain, not similar.  Difficulty sleeping, muscle relaxer- helps with sleep.  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  Stairs at home: no.  Previous/recent imaging- pt states pars defect or fracture. Getting another MRI later this week.    PERTINENT HISTORY:    PAIN:  Are you having pain? Yes: NPRS scale:  0-2/10 Pain location: low back, down R LE, some tingling.  Pain description: achey,  Aggravating factors: sitting,  Relieving factors: none stated    PRECAUTIONS: Back     WEIGHT BEARING RESTRICTIONS No  FALLS:   Has patient fallen in last 6 months? No   PLOF: Independent  PATIENT GOALS   Decreased pain, return to regular movement/activity.    OBJECTIVE: updated 03/06/22   DIAGNOSTIC FINDINGS:   COGNITION:  Overall cognitive status: Within functional limits for tasks assessed   PALPATION: Mild tenderness in low lumbar region.    LUMBAR ROM:   Active  AROM  eval  Flexion WFL  Extension WFL  Right lateral flexion WFL  Left lateral flexion WFL  Right rotation   Left rotation    (Blank rows = not tested) Hip ROM: WFL     LOWER EXTREMITY MMT:    MMT Right eval Left eval  Hip flexion 4+ 4+  Hip extension    Hip abduction 4+ 4+  Hip adduction    Hip internal rotation 4+ 4+  Hip external rotation 4+ 4+  Knee flexion 5 5  Knee extension 5 5  Ankle dorsiflexion    Ankle plantarflexion    Ankle inversion    Ankle eversion     (Blank rows = not tested)    TODAY'S TREATMENT   03/06/2022 Therapeutic Exercise: Aerobic: Recumbent bike L2 x 8 min;  Supine:  Bridging  x 20;    Seated:  Prone :  Quadruped:   bird dog x 20; Planks 20 sec x 3;  Standing: Rows Black TB x 20;   Paloff press double blueTB x 15 bil;  Squats to higher mat table x 25, 10 lb with TA;  Lumbar extension x 10 (education on form and use)  Stretches:  hip ER fallouts x 20;     PATIENT EDUCATION:  Education details: updated and  Reviewed HEP,  Person educated: Patient Education method: Consulting civil engineer, Demonstration, Tactile cues, Verbal cues, and Handouts Education comprehension: verbalized understanding, returned demonstration, verbal cues required, tactile cues required, and needs further education   HOME EXERCISE PROGRAM: Access Code: [email or web address]   ASSESSMENT:  CLINICAL IMPRESSION: 03/06/2022  Pt doing very well at this time. New pain that arose in last week, has resolved. She has not had other back or leg pain in a couple  weeks now. She is doing very well with strengthening and ability for HEP. She has much improved ROM/full lumbar ROM, and ability for functional activity at home. She has met goals at this time, and is ready for d/c to HEP. Pt in agreement with plan . Discussed f/u with Sports med if she has increased or changed symptoms in future.    OBJECTIVE IMPAIRMENTS decreased activity tolerance, decreased mobility, decreased strength, increased muscle spasms, and pain.   ACTIVITY LIMITATIONS carrying, lifting, bending, sitting, standing, squatting, stairs, bathing, and locomotion level  PARTICIPATION LIMITATIONS: cleaning, laundry, shopping, community activity, and yard work  PERSONAL FACTORS  none  are also affecting patient's functional outcome.   REHAB POTENTIAL: Good  CLINICAL DECISION MAKING: Stable/uncomplicated  EVALUATION COMPLEXITY: Low   GOALS: Goals reviewed with patient? Yes  SHORT TERM GOALS: Target date: 01/16/2022  Pt to be independent with initial HEP  Goal status: MET  2.  Pt to demo ability for transfers with mechanics WFL, and minimal pain.   Goal status: MET  3.  Pt to report ability to bend/flex for ADLS with optimal body mechanics and minimal pain.  Goal status: MET    LONG TERM GOALS: Target date: 04/10/2022  Pt to be independent with final HEP  Goal status: MET  2.Pt to report decreased pain in back and LEs to 0-2/10 with work duties and functional  activity.   Goal status: MET  3.  Pt to demo ability to bend/squat with optimal body mechanics, for improved ability for IADLs.   Goal status: MET  4.  Pt to demo improved strength of hips and core to be at least 4+/5/ [facility] for pt age and dx.   Goal status: MET     PLAN: PT FREQUENCY: 1-2x/week  PT DURATION: 8 weeks  PLANNED INTERVENTIONS: Therapeutic exercises, Therapeutic activity, Neuromuscular re-education, Balance training, Gait training, Patient/Family education, Self Care, Joint mobilization, DME instructions, Dry Needling, Electrical stimulation, Spinal manipulation, Spinal mobilization, Cryotherapy, Moist heat, Ultrasound, Ionotophoresis 85m/ml Dexamethasone, and Manual therapy.  PLAN FOR NEXT SESSION:   LLyndee Hensen PT, DPT 11:26 AM  03/06/22    PHYSICAL THERAPY DISCHARGE SUMMARY  Visits from Start of Care: 11   Plan: Patient agrees to discharge.  Patient goals were met. Patient is being discharged due to meeting the stated rehab goals.      LLyndee Hensen PT, DPT 11:38 AM  03/06/22

## 2022-03-20 ENCOUNTER — Encounter: Payer: 59 | Admitting: Physical Therapy

## 2022-05-24 ENCOUNTER — Encounter: Payer: Self-pay | Admitting: *Deleted

## 2022-10-29 ENCOUNTER — Ambulatory Visit (INDEPENDENT_AMBULATORY_CARE_PROVIDER_SITE_OTHER): Payer: 59 | Admitting: Family Medicine

## 2022-10-29 ENCOUNTER — Encounter: Payer: Self-pay | Admitting: Family Medicine

## 2022-10-29 VITALS — BP 110/64 | HR 78 | Temp 98.4°F | Ht 63.0 in | Wt 152.2 lb

## 2022-10-29 DIAGNOSIS — M79644 Pain in right finger(s): Secondary | ICD-10-CM | POA: Diagnosis not present

## 2022-10-29 MED ORDER — MELOXICAM 15 MG PO TABS: 15.0000 mg | ORAL_TABLET | Freq: Every day | ORAL | 0 refills | Status: DC

## 2022-10-29 NOTE — Patient Instructions (Addendum)
Let us know if you get anymore COVID vaccines.   a lot of pain on palmar side of metacarpophalangeal joint joint and in surrounding tissues on right hand. She is right handed and this is really affecting her work. We are going to try meloxicam 15 mg each morning and can apply topical Voltaren/diclofenac if she would like as well.  - also refer to sports medicine - I wonder if she could have an irritated ganglion cyst in this area- difficult to get full exam with level of pain- I think ultrasound could be beneficial. We considered x-ray but thought likely lower yield.   Records request has been sent for GYN.  If you had drastically worsening pain or new symptoms such as fever/chills let me know   We have placed a referral for you today to sports medicine. See # listed and you can call them directly today or tomorrow if you don't hear

## 2022-10-29 NOTE — Progress Notes (Signed)
Phone [phone] In person visit   Subjective:   Pamela Richards is a 37 y.o. year old very pleasant female patient who presents for/with See problem oriented charting Chief Complaint  Patient presents with   right thumb pain    Pt c/o right thumb pain that goes up into her hand that started a few weeks ago that has gotten worse, denies injury.   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     orphenadrine (NORFLEX) 100 MG tablet Take 100 mg by mouth 2 (two) times daily.     No current facility-administered medications for this visit.     Objective:  BP 110/64   Pulse 78   Temp 98.4 F (36.9 C)   Ht 5\' 3"  (1.6 m)   Wt 152 lb 3.2 oz (69 kg)   SpO2 96%   BMI 26.96 kg/m  Gen: NAD, resting comfortably CV: RRR no murmurs rubs or gallops Lungs: CTAB no crackles, wheeze, rhonchi MSK: Left hand normal, right hand.  Painful with palpation at MCP joint-some edema distal and proximal to this and very tender in these areas.  Pain with opposed opposition of thumb but not as bad as direct palpation.  No erythema, warmth in these areas.  No pain at IP joint or CMC joint.     Assessment and Plan   # right thumb pain S:patient states symptoms started a few weeks ago but are worsening. No injury reported. No falls. Pain noted fron thumb into the hand . Pain focused at metacarpophalangeal joint joint- tender to touch feels more full than opposite metacarpophalangeal joint. Pain with writing- sometimes uses opposite hand to write. A lot of pain with thumb opposition.  Pain up to 8/10 with pressure. Radiation into arm at times but not as intense as joint  itself. Tried a splint but ended up causing pain in other ways. Normal grip strength other than stopped by pain. No fever/chills/redness etc.  -2 aleve 2 days in a row did not help last week and stopped  -intermittent issues with back- has considered reaching back out to consider injections.  A/P: a lot of pain on palmar side of metacarpophalangeal joint joint and in surrounding tissues on right hand-seems to have some edema-very painful so hard to get extensive exam. She is right handed and this is really affecting her work. We are going to try meloxicam 15 mg each morning and can apply topical Voltaren/diclofenac if she would like as well.  - also refer to sports medicine - I wonder if she could have an irritated ganglion cyst in this area- difficult to get full exam with level of pain- I think ultrasound could be beneficial. We considered x-ray but thought likely lower yield.  -She feels like swelling seems to be worsening some-we discussed if redness, fever, chills to let us know ASAP-would make infection more likely at that point or something like gout  #former smoker- quit on new years! Congratulated her!  Recommended follow up: Return for as needed for new, worsening, persistent symptoms. Future Appointments  Date Time Provider Department Center  05/02/2023  1:00 PM Shelva Majestic, MD [facility]  Lab/Order associations:   ICD-10-CM   1. Pain of right thumb  M79.644 Ambulatory referral to Sports Medicine      Meds ordered this encounter  Medications   meloxicam (MOBIC) 15 MG tablet    Sig: Take 1 tablet (15 mg total) by mouth daily. Start with first 7-10 days only    Dispense:  20 tablet    Refill:  0    Time Spent: 29 minutes of total time (1:40 PM- 2:09 PM) was spent on the date of the encounter performing the following actions: chart review prior to seeing the patient, obtaining history, performing a medically necessary exam, counseling on the treatment plan and reason for  workup including sports medicine referral and possible ultrasound, placing orders, and documenting in our EHR.  Return precautions advised.  Tana Conch, MD

## 2022-11-01 ENCOUNTER — Other Ambulatory Visit: Payer: Self-pay

## 2022-11-01 ENCOUNTER — Ambulatory Visit (INDEPENDENT_AMBULATORY_CARE_PROVIDER_SITE_OTHER): Payer: 59 | Admitting: Family Medicine

## 2022-11-01 VITALS — BP 106/72 | HR 74 | Ht 63.0 in | Wt 150.0 lb

## 2022-11-01 DIAGNOSIS — M79641 Pain in right hand: Secondary | ICD-10-CM

## 2022-11-01 DIAGNOSIS — G8929 Other chronic pain: Secondary | ICD-10-CM

## 2022-11-01 DIAGNOSIS — M65311 Trigger thumb, right thumb: Secondary | ICD-10-CM

## 2022-11-01 MED ORDER — LIDOCAINE 5 % EX PTCH: 1.0000 | MEDICATED_PATCH | Freq: Two times a day (BID) | CUTANEOUS | 12 refills | Status: DC

## 2022-11-01 NOTE — Progress Notes (Signed)
   Rubin Payor, PhD, LAT, ATC acting as a scribe for Clementeen Graham, MD.  Pamela Richards is a 37 y.o. female who presents to [facility] at [facility] today for R hand pain. Pt was previously seen by Dr. Denyse Amass on 02/06/22 for LBP.  Today, pt c/o R hand pain x several weeks. No MOI. She locates pain to the R 1st digit, through the MCP joint and into the thenar eminence. 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   Swelling: yes Aggravates: writing, pulling pants up, fixing hair Treatments tried: naproxen, meloxicam  Pertinent review of systems: No fevers or chills  Relevant historical information: History of lumbar pars defects and chronic low back pain.   Exam:  BP 106/72   Pulse 74   Ht 5\' 3"  (1.6 m)   Wt 150 lb (68 kg)   SpO2 98%   BMI 26.57 kg/m  General: Well Developed, well nourished, and in no acute distress.   MSK: Right hand some swelling present at palmar MCP thumb.  Tender palpation palmar MCP thumb. Very limited motion of IP joint.  Otherwise thumb motion is intact. Strength is intact within limits of motion.    Lab and Radiology Results  Procedure: Real-time Ultrasound Guided Injection of right hand first MCP A1 pulley tendon sheath (trigger thumb injection) Device: Philips Affiniti 50G Images permanently stored and available for review in PACS Verbal informed consent obtained.  Discussed risks and benefits of procedure. Warned about infection, bleeding, hyperglycemia damage to structures among others. Patient expresses understanding and agreement Time-out conducted.   Noted no overlying erythema, induration, or other signs of local infection.   Skin prepped in a sterile fashion.   Local anesthesia: Topical Ethyl chloride.   With sterile technique and under real time ultrasound guidance: 40 mg of Kenalog and 1 mL of lidocaine injected into tendon sheath at first A1 pulley. Fluid seen entering the tendon sheath.    Completed without difficulty   Pain immediately resolved suggesting accurate placement of the medication.   Advised to call if fevers/chills, erythema, induration, drainage, or persistent bleeding.   Images permanently stored and available for review in the ultrasound unit.  Impression: Technically successful ultrasound guided injection.         Assessment and Plan: 37 y.o. female with right hand and thumb pain thought to be due to trigger thumb.  Plan for injection today double Band-Aid splint.  Recheck back as needed.  Lidocaine patches refilled.   PDMP not reviewed this encounter. Orders Placed This Encounter  Procedures   [place] LIMITED JOINT SPACE STRUCTURES UP RIGHT(NO LINKED CHARGES)    Order Specific Question:   Reason for Exam (SYMPTOM  OR DIAGNOSIS REQUIRED)    Answer:   right hand pain    Order Specific Question:   Preferred imaging location?    Answer:   Adult nurse [facility] ordered this encounter  Medications   lidocaine (LIDODERM) 5 %    Sig: Place 1 patch onto the skin every 12 (twelve) hours. Remove & Discard patch within 12 hours or as directed by MD    Dispense:  30 patch    Refill:  12     Discussed warning signs or symptoms. Please see discharge instructions. Patient expresses understanding.   The above documentation has been reviewed and is accurate and complete Clementeen Graham, M.D.

## 2022-11-01 NOTE — Patient Instructions (Addendum)
Thank you for coming in today.   You received an injection today. Seek immediate medical attention if the joint becomes red, extremely painful, or is oozing fluid.   Try using the Double Band-aid Splint  Check back as needed  Have fun on your trip!

## 2022-11-07 ENCOUNTER — Telehealth: Payer: Self-pay

## 2022-11-07 NOTE — Telephone Encounter (Signed)
Lidocaine 5% patches   CoverMyMeds KEY: [id number]

## 2022-11-08 NOTE — Telephone Encounter (Signed)
Prior Authorization initiated for LIDOCAINE PATCH via CoverMyMeds.[email or web address] KEY: [id number]

## 2022-11-12 NOTE — Telephone Encounter (Signed)
Lidocaine Patch coverage DENIED by [place] / [employer].   Coverage of the requested medication is provided for postherpetic neuralgia, neuropathic pain, low back pain, and osteoarthritis. Other conditions fir coverage may apply. Coverage cannot be authorized at this time.

## 2022-11-15 ENCOUNTER — Ambulatory Visit: Payer: 59 | Admitting: Family Medicine

## 2023-02-08 IMAGING — CT CT RENAL STONE PROTOCOL
2 of 4 series · 16 of 46 positions shown, 18 images · non-contrast
Comparison: CT abdomen and pelvis 12/23/2004.

CLINICAL DATA: Left flank pain.

EXAM:
CT ABDOMEN AND PELVIS WITHOUT CONTRAST
TECHNIQUE: Multidetector CT imaging of the abdomen and pelvis was performed
following the standard protocol without IV contrast.

[Series 2: axial st · axial · 0.76mm/px · z∈[+938,+1323]mm · 13 of 87 slices shown, 15 images]
[im 5/87  soft-tissue]
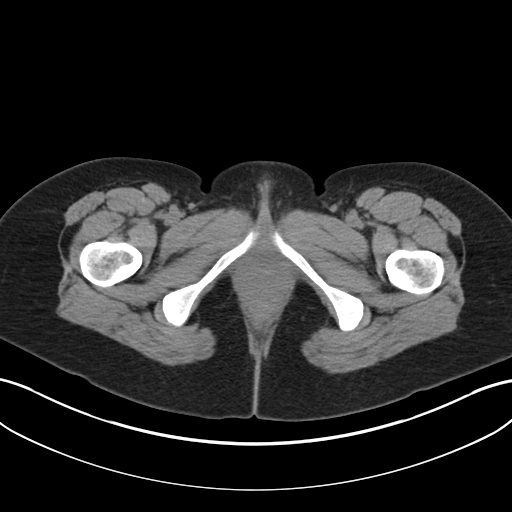
[im 5/87  bone]
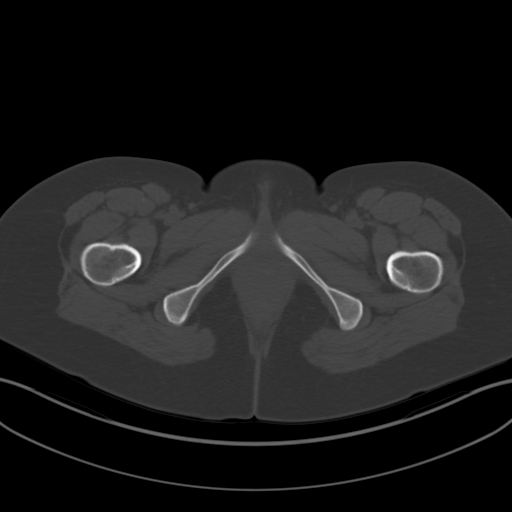
[im 10/87  soft-tissue]
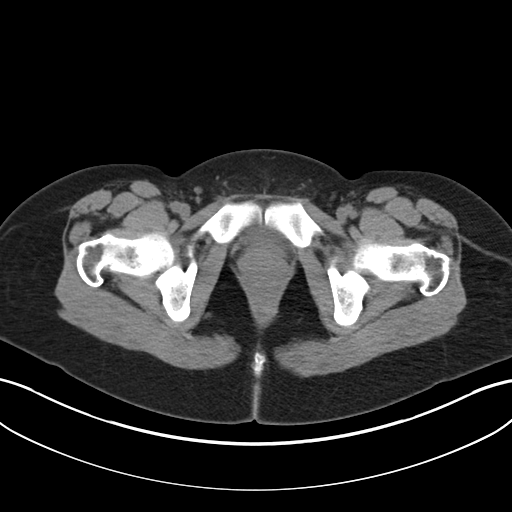
[im 20/87  soft-tissue]
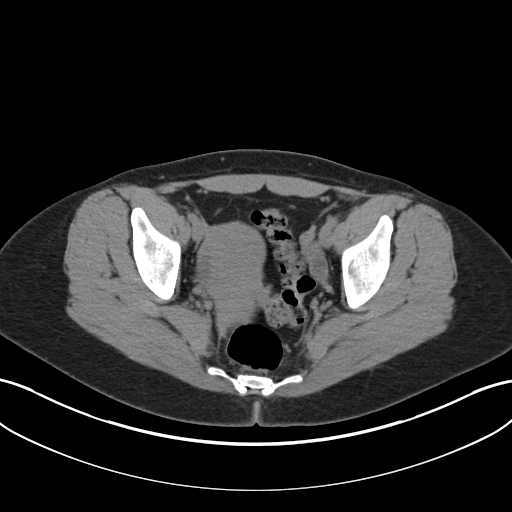
[im 24/87  soft-tissue]
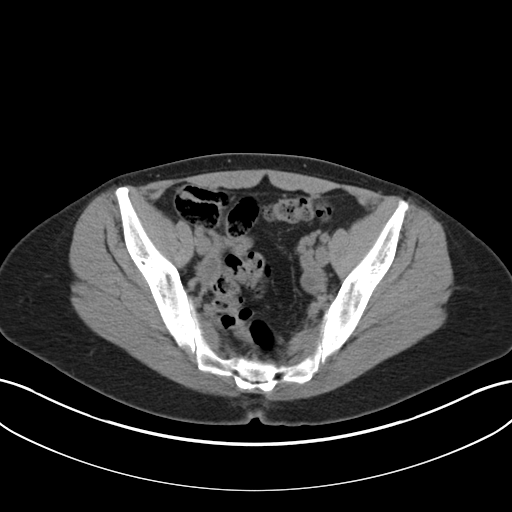
[im 29/87  soft-tissue]
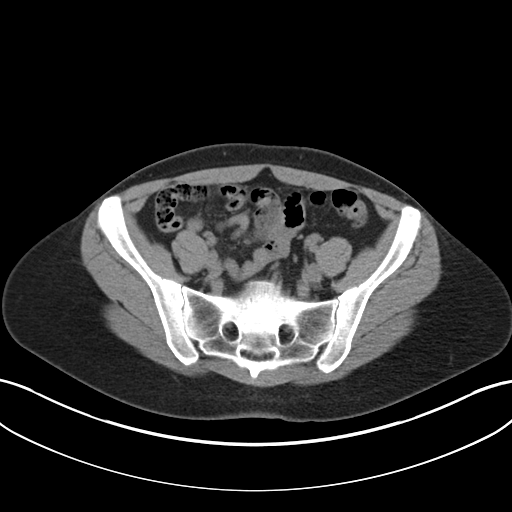
[im 39/87  soft-tissue]
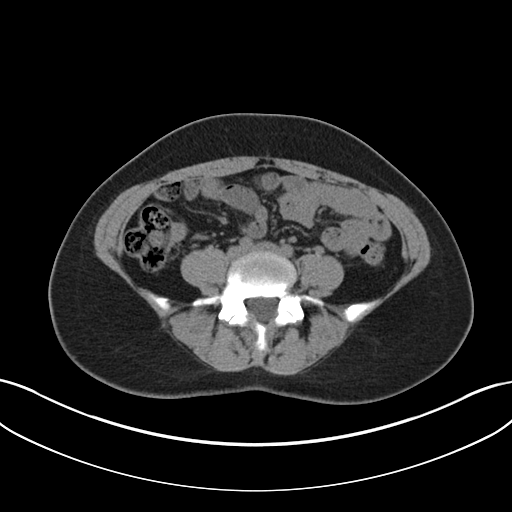
[im 44/87  soft-tissue]
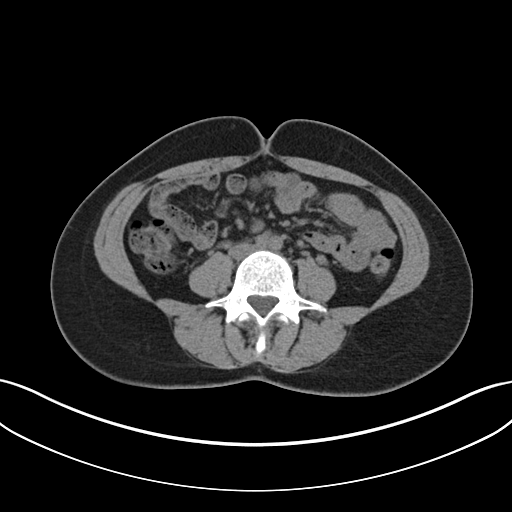
[im 48/87  soft-tissue]
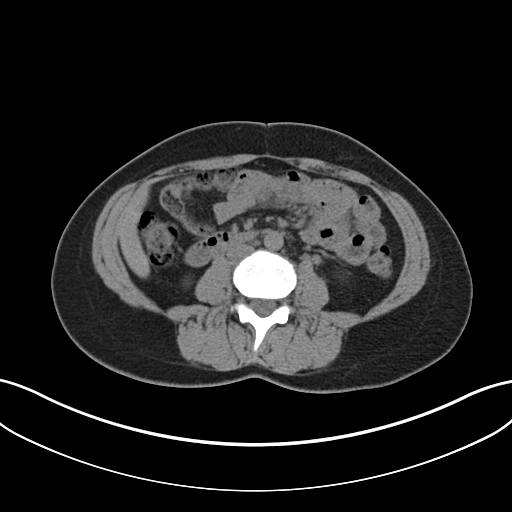
[im 58/87  soft-tissue]
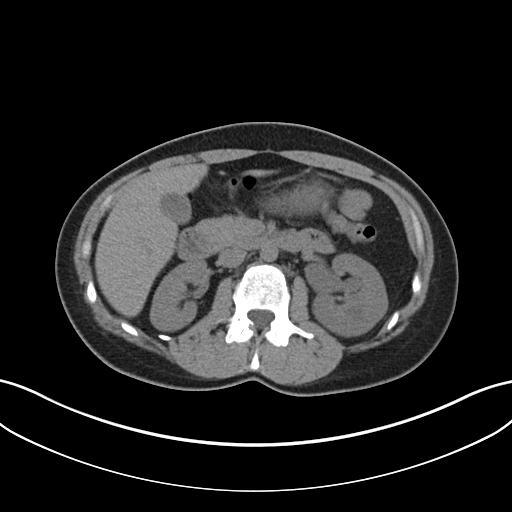
[im 58/87  bone]
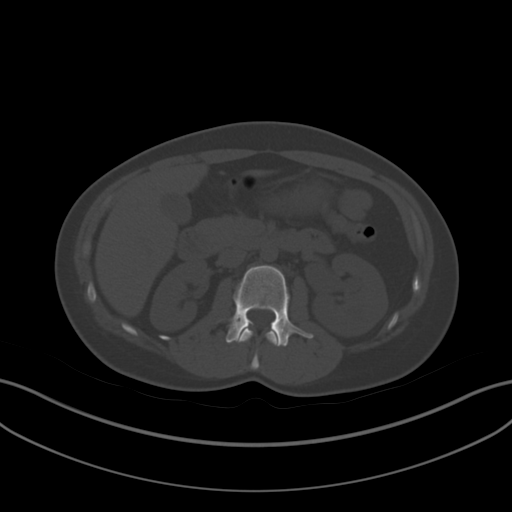
[im 63/87  soft-tissue]
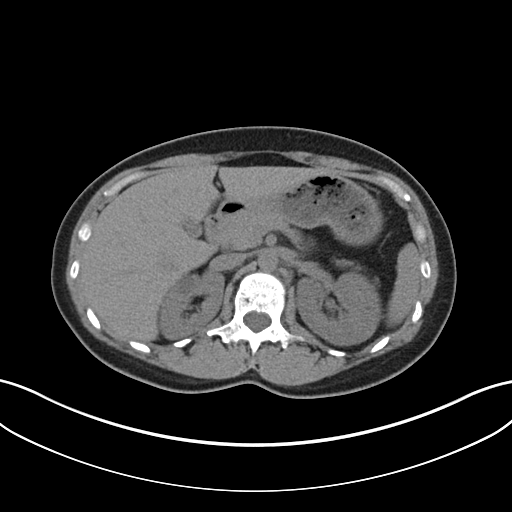
[im 67/87  soft-tissue]
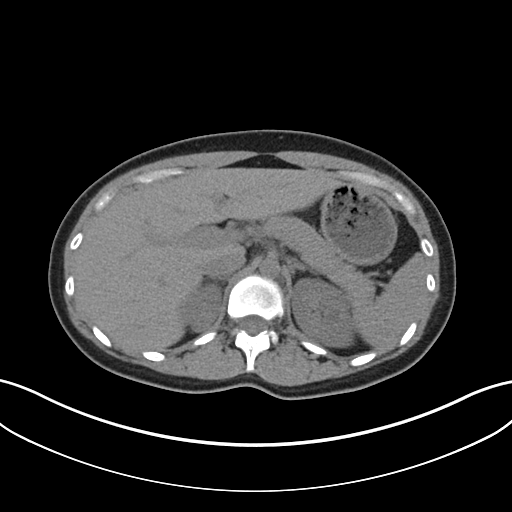
[im 77/87  soft-tissue]
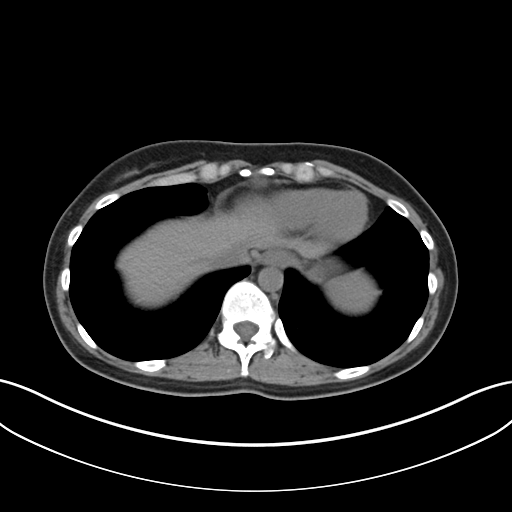
[im 82/87  soft-tissue]
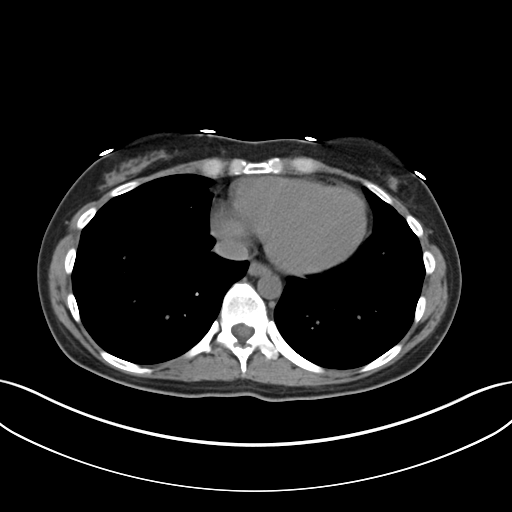

[Series 5: coronal · coronal · 0.71mm/px · 3 of 151 slices shown]
[im 51/151  soft-tissue]
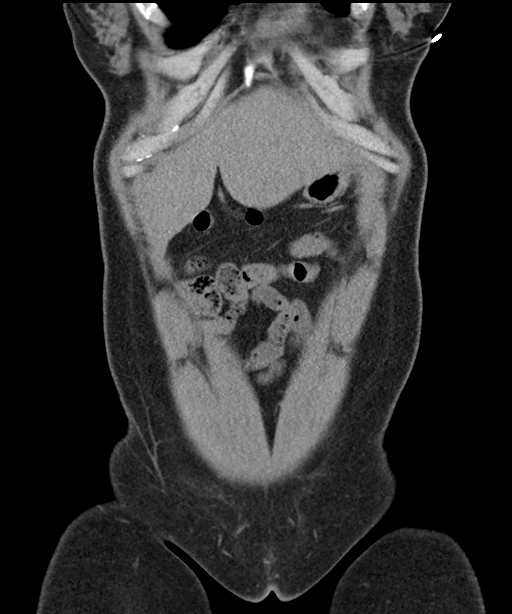
[im 67/151  soft-tissue]
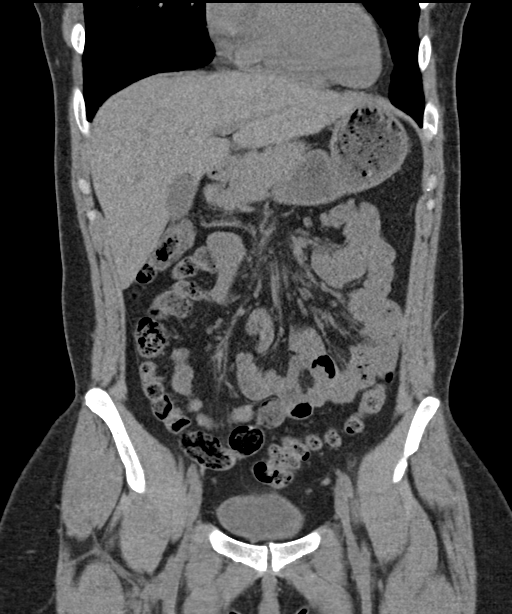
[im 84/151  soft-tissue]
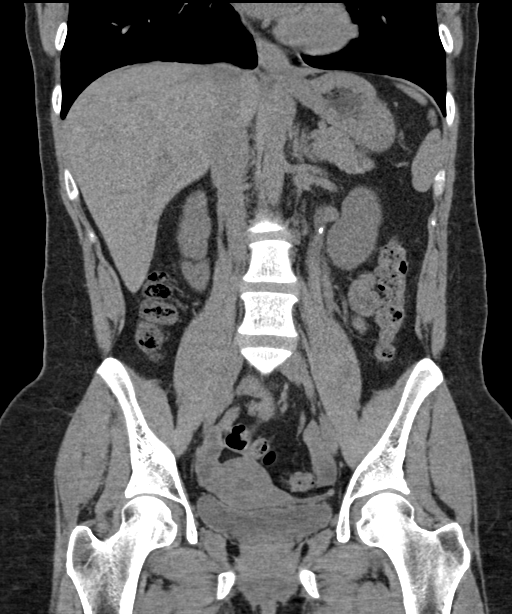

[16 of 46 positions shown; findings below may reference images not displayed]

FINDINGS: Lower chest: No acute abnormality.

Hepatobiliary: No focal liver abnormality is seen. No gallstones,
gallbladder wall thickening, or biliary dilatation.

Pancreas: Unremarkable. No pancreatic ductal dilatation or
surrounding inflammatory changes.

Spleen: Normal in size without focal abnormality.

Adrenals/Urinary Tract: There is a 4 mm calculus in the proximal
left ureter. There is mild left-sided hydronephrosis.

There are additional nonobstructing bilateral renal calculi. There
is a 16 mm cyst in the right kidney.

Adrenal glands and bladder are within normal limits.

Stomach/Bowel: Stomach is within normal limits. Appendix appears
normal. No evidence of bowel wall thickening, distention, or
inflammatory changes. There is sigmoid colon diverticulosis without
evidence for acute diverticulitis.

Vascular/Lymphatic: No significant vascular findings are present. No
enlarged abdominal or pelvic lymph nodes.

Reproductive: Uterus and bilateral adnexa are unremarkable.

Other: No abdominal wall hernia or abnormality. No abdominopelvic
ascites.

Musculoskeletal: No acute or significant osseous findings.
IMPRESSION: 1. 4 mm calculus in the proximal left ureter with mild obstructive
uropathy.
2. Additional nonobstructing bilateral renal calculi.
3. Sigmoid colon diverticulosis.

## 2023-05-02 ENCOUNTER — Ambulatory Visit: Payer: 59 | Admitting: Family Medicine

## 2023-05-02 ENCOUNTER — Encounter: Payer: Self-pay | Admitting: Family Medicine

## 2023-05-02 VITALS — BP 100/70 | HR 84 | Temp 98.5°F | Ht 63.0 in | Wt 154.0 lb

## 2023-05-02 DIAGNOSIS — Z13220 Encounter for screening for lipoid disorders: Secondary | ICD-10-CM

## 2023-05-02 DIAGNOSIS — Z0000 Encounter for general adult medical examination without abnormal findings: Secondary | ICD-10-CM

## 2023-05-02 DIAGNOSIS — Z23 Encounter for immunization: Secondary | ICD-10-CM

## 2023-05-02 DIAGNOSIS — Z130 Encounter for screening for diseases of the blood and blood-forming organs and certain disorders involving the immune mechanism: Secondary | ICD-10-CM

## 2023-05-02 DIAGNOSIS — Z87891 Personal history of nicotine dependence: Secondary | ICD-10-CM

## 2023-05-02 DIAGNOSIS — Z131 Encounter for screening for diabetes mellitus: Secondary | ICD-10-CM

## 2023-05-02 DIAGNOSIS — E663 Overweight: Secondary | ICD-10-CM

## 2023-05-02 NOTE — Patient Instructions (Addendum)
Please stop by lab before you go If you have mychart- we will send your results within 3 business days of [place] receiving them.  If you do not have mychart- we will call you about results within 5 business days of [place] receiving them.  *please also note that you will see labs on mychart as soon as they post. I will later go in and write notes on them- will say "notes from Dr. Durene Cal"   Glad you are doing well- could try cortisone on the thumb but I'm not overly confident that will help. Could also ask Dr. Denyse Amass directly  Lets try to get back to 150 minutes a week exercise but glad you are already walking some and mild weight loss reasonable  Great job quitting smoking!   Recommended follow up: Return in about 1 year (around 05/01/2024) for physical or sooner if needed.Schedule b4 you leave.

## 2023-05-02 NOTE — Progress Notes (Signed)
Phone [phone]   Subjective:  Patient presents today for their annual physical. Chief complaint-noted.   See problem oriented charting- ROS- full  review of systems was completed and negative Per full ROS sheet completed by patient, Still has some sensitivity at site of injection- slightly more red but not worsening- could try cortisone  The following were reviewed and entered/updated in epic: Past Medical History:  Diagnosis Date   Chicken pox    GAD (generalized anxiety disorder)    also history of depression now controlled. zoloft workedbest. Lexapro had to take same time every day or SE.    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   Acne 06/01/2016   GAD (generalized anxiety disorder)    Kidney stones    Past Surgical History:  Procedure Laterality Date   LITHOTRIPSY     URETERAL STENT PLACEMENT     x2    Family History  Problem Relation Age of Onset   Skin cancer Paternal Grandmother    Hypertension Maternal Grandmother    Prostate cancer Paternal Grandfather    Lung cancer Paternal Grandfather    Breast cancer Paternal Aunt        mallignant tumor of breast     Medications- reviewed and updated Current Outpatient Medications  Medication Sig Dispense Refill   levonorgestrel-ethinyl estradiol (VIENVA) 0.1-20 MG-MCG tablet Take 1 tablet by mouth daily. Through GYN     orphenadrine (NORFLEX) 100 MG tablet Take 100 mg by mouth 2 (two) times daily.     No current facility-administered medications for this visit.    Allergies-reviewed and updated Allergies  Allergen Reactions   Bactrim [Sulfamethoxazole-Trimethoprim]    Ceclor [Cefaclor]    Hydrocodone-Acetaminophen Nausea And Vomiting   Penicillins     Social History   Social History Narrative   Married. Husband Ronaldo Miyamoto patient of Dr. Durene Cal. No kids. 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 Oropharynx normal Neck: no thyromegaly CV: RRR no murmurs rubs or gallops Lungs: CTAB no crackles, wheeze, rhonchi Abdomen: soft/nontender/nondistended/normal bowel sounds. No rebound or guarding.  Ext: no edema Skin: warm, dry Neuro: grossly normal, moves all extremities, PERRLA   Assessment and Plan   37 y.o. female presenting for annual physical.  Health Maintenance counseling: 1. Anticipatory guidance: Patient counseled regarding regular dental exams - planning q6 months but switching dentists, eye exams-typically yearly with glasses- planning in the spring,  avoiding smoking and second hand smoke- see below , limiting alcohol to 1 beverage per day-3-6 per week glasses of wine , no illicit drugs .   2. Risk factor reduction:  Advised patient of need for regular exercise and diet rich and fruits and vegetables to reduce risk of heart attack and stroke.  Exercise- walking some- wants to get back on back exercises.  Diet/weight management-mild weight gain this year-discussed reversing the trend- reports working on portions  and adjusting dinner meals. Wants to get back to 130s Wt Readings from Last 3 Encounters:  05/02/23 154 lb (69.9 kg)  11/01/22 150 lb (68 kg)  10/29/22 152 lb 3.2 oz (69 kg)  3. Immunizations/screenings/ancillary studies-considering COVID shot at pharmacy, did flu shot today  Immunization History  Administered Date(s) Administered   Influenza, Seasonal, Injecte, Preservative Fre 05/02/2023   Influenza,inj,Quad PF,6+ Mos 03/31/2021   Influenza-Unspecified 01/31/2016   PFIZER(Purple  Top)SARS-COV-2 Vaccination 07/16/2019, 08/06/2019   Pfizer Covid-19 Vaccine Bivalent Booster 61yrs & up 06/25/2020   Tdap 06/01/2016   4. Cervical cancer screening- August 11 2018 HPV negative, normal Pap smear with repeat in 5 years per Dr. Ellyn Hack- will get an update in march 5. Breast cancer screening-sees GYN.  Did have a paternal aunt with breast cancer at in young 71's- she wants to see if she can get baseline with GYN - if not we can try to set up with breast center and see if covered.  6. Colon cancer screening - no family history, start at age 24 . No bright red blood per rectum or melena  7. Skin cancer screening- no dermatology - has seen in past and may update. advised regular sunscreen use. Denies worrisome, changing, or new skin lesions.  8. Birth control/STD check- on birth control - painful cycles and was told cysts on ultrasound- still has discomfofrt 9. Osteoporosis screening at 32- will plan at later date 10. Smoking associated screening -former smoker-quit on new years this year- will get UA- states doesn't even miss it!   Status of chronic or acute concerns   # Trigger thumb-responded nicely to injection with Dr. Denyse Amass  #screening hyperlipidemia and also for diabetes and anema  Lab Results  Component Value Date   CHOL 123 06/08/2016   HDL 45 06/08/2016   LDLCALC 65 06/08/2016   TRIG 63 06/08/2016   CHOLHDL 2.7 06/08/2016    Recommended follow up: Return in about 1 year (around 05/01/2024) for physical or sooner if needed.Schedule b4 you leave.  Lab/Order associations:NOT fasting- tacos at 11 2 and refried beans and rice   ICD-10-CM   1. Preventative health care  Z00.00     2. Need for influenza vaccination  Z23 Flu vaccine trivalent PF, 6mos and older(Flulaval,Afluria,Fluarix,Fluzone)    3. Screening for hyperlipidemia  Z13.220 Comprehensive metabolic panel    Lipid panel    4. Screening for deficiency anemia  Z13.0 CBC with Differential/Platelet    5. Former  smoker  Z87.891 Urinalysis, Routine w reflex microscopic    6. Overweight  E66.3 HgB A1c    7. Screening for diabetes mellitus  Z13.1 HgB A1c     No orders of the defined types were placed in this encounter.  Return precautions advised.  Tana Conch, MD

## 2023-05-03 LAB — URINALYSIS, ROUTINE W REFLEX MICROSCOPIC

## 2023-05-03 LAB — COMPREHENSIVE METABOLIC PANEL
ALT: 25 [IU]/L (ref 0–32)
AST: 23 [IU]/L (ref 0–40)
Albumin: 4.1 g/dL (ref 3.9–4.9)
Alkaline Phosphatase: 73 [IU]/L (ref 44–121)
BUN/Creatinine Ratio: 18 (ref 9–23)
BUN: 13 mg/dL (ref 6–20)
Bilirubin Total: 0.3 mg/dL (ref 0.0–1.2)
CO2: 23 mmol/L (ref 20–29)
Calcium: 9 mg/dL (ref 8.7–10.2)
Chloride: 104 mmol/L (ref 96–106)
Creatinine, Ser: 0.72 mg/dL (ref 0.57–1.00)
Globulin, Total: 2.7 g/dL (ref 1.5–4.5)
Glucose: 74 mg/dL (ref 70–99)
Potassium: 4.1 mmol/L (ref 3.5–5.2)
Sodium: 141 mmol/L (ref 134–144)
Total Protein: 6.8 g/dL (ref 6.0–8.5)
eGFR: 111 mL/min/{1.73_m2} (ref >59)

## 2023-05-03 LAB — CBC WITH DIFFERENTIAL/PLATELET
Basophils Absolute: 0 10*3/uL (ref 0.0–0.2)
Basos: 1 %
EOS (ABSOLUTE): 0.1 10*3/uL (ref 0.0–0.4)
Eos: 1 %
Hematocrit: 41.6 % (ref 34.0–46.6)
Hemoglobin: 13.6 g/dL (ref 11.1–15.9)
Immature Grans (Abs): 0 10*3/uL (ref 0.0–0.1)
Immature Granulocytes: 0 %
Lymphocytes Absolute: 2.3 10*3/uL (ref 0.7–3.1)
Lymphs: 42 %
MCH: 31.6 pg (ref 26.6–33.0)
MCHC: 32.7 g/dL (ref 31.5–35.7)
MCV: 97 fL (ref 79–97)
Monocytes Absolute: 0.6 10*3/uL (ref 0.1–0.9)
Monocytes: 12 %
Neutrophils Absolute: 2.4 10*3/uL (ref 1.4–7.0)
Neutrophils: 44 %
Platelets: 301 10*3/uL (ref 150–450)
RBC: 4.3 x10E6/uL (ref 3.77–5.28)
RDW: 11.8 % (ref 11.7–15.4)
WBC: 5.4 10*3/uL (ref 3.4–10.8)

## 2023-05-03 LAB — LIPID PANEL
Chol/HDL Ratio: 3.1 ratio (ref 0.0–4.4)
Cholesterol, Total: 141 mg/dL (ref 100–199)
HDL: 46 mg/dL (ref >39)
LDL Chol Calc (NIH): 75 mg/dL (ref 0–99)
Triglycerides: 112 mg/dL (ref 0–149)
VLDL Cholesterol Cal: 20 mg/dL (ref 5–40)

## 2023-05-03 LAB — HEMOGLOBIN A1C
Est. average glucose Bld gHb Est-mCnc: 103 mg/dL
Hgb A1c MFr Bld: 5.2 % (ref 4.8–5.6)

## 2024-05-01 ENCOUNTER — Ambulatory Visit (INDEPENDENT_AMBULATORY_CARE_PROVIDER_SITE_OTHER): Payer: 59 | Admitting: Family Medicine

## 2024-05-01 ENCOUNTER — Encounter: Payer: Self-pay | Admitting: Family Medicine

## 2024-05-01 VITALS — BP 112/68 | HR 80 | Temp 98.1°F | Ht 63.0 in | Wt 154.2 lb

## 2024-05-01 DIAGNOSIS — Z131 Encounter for screening for diabetes mellitus: Secondary | ICD-10-CM

## 2024-05-01 DIAGNOSIS — Z130 Encounter for screening for diseases of the blood and blood-forming organs and certain disorders involving the immune mechanism: Secondary | ICD-10-CM | POA: Diagnosis not present

## 2024-05-01 DIAGNOSIS — E663 Overweight: Secondary | ICD-10-CM

## 2024-05-01 DIAGNOSIS — F411 Generalized anxiety disorder: Secondary | ICD-10-CM

## 2024-05-01 DIAGNOSIS — Z87891 Personal history of nicotine dependence: Secondary | ICD-10-CM

## 2024-05-01 DIAGNOSIS — Z0001 Encounter for general adult medical examination with abnormal findings: Secondary | ICD-10-CM

## 2024-05-01 DIAGNOSIS — Z13220 Encounter for screening for lipoid disorders: Secondary | ICD-10-CM | POA: Diagnosis not present

## 2024-05-01 DIAGNOSIS — R1024 Suprapubic pain: Secondary | ICD-10-CM

## 2024-05-01 NOTE — Progress Notes (Signed)
 Phone [phone]   Subjective:  Patient presents today for their annual physical. Chief complaint-noted.   See problem oriented charting- ROS- full  review of systems was completed and negative except for topics noted under acute/chronic concerns   The following were reviewed and entered/updated in epic: Past Medical History:  Diagnosis Date   Chicken pox    GAD (generalized anxiety disorder)    also history of depression now controlled. zoloft workedbest. Lexapro had to take same time every day or SE.    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   Acne 06/01/2016   GAD (generalized anxiety disorder)    Kidney stones    Past Surgical History:  Procedure Laterality Date   LITHOTRIPSY     URETERAL STENT PLACEMENT     x2    Family History  Problem Relation Age of Onset   Skin cancer Paternal Grandmother    Hypertension Maternal Grandmother    Prostate cancer Paternal Grandfather    Lung cancer Paternal Grandfather    Breast cancer Paternal Aunt        mallignant tumor of breast     Medications- reviewed and updated Current Outpatient Medications  Medication Sig Dispense Refill   levonorgestrel-ethinyl estradiol (VIENVA) 0.1-20 MG-MCG tablet Take 1 tablet by mouth daily. Through GYN     orphenadrine (NORFLEX) 100 MG tablet Take 100 mg by mouth 2 (two) times daily.     No current facility-administered medications for this visit.    Allergies-reviewed and updated Allergies  Allergen Reactions   Bactrim [Sulfamethoxazole-Trimethoprim]    Ceclor [Cefaclor]    Hydrocodone-Acetaminophen  Nausea And Vomiting   Penicillins     Social History   Social History Narrative   Married. Husband Rockey patient of Dr. Katrinka. No kids. 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 Oropharynx normal Neck: no thyromegaly CV: RRR no murmurs rubs or gallops Lungs: CTAB no crackles, wheeze, rhonchi Abdomen: soft/tender in suprapubic area- winces with pain/nondistended/normal bowel sounds. No rebound or guarding.  Ext: no edema Skin: warm, dry Neuro: grossly normal, moves all extremities, PERRLA   Assessment and Plan   38 y.o. female presenting for annual physical.  Health Maintenance counseling: 1. Anticipatory guidance: Patient counseled regarding regular dental exams -advised q6 months, eye exams - no recent vision issues,  avoiding smoking and second hand smoke , limiting alcohol to 1 beverage per day- 3-6 per week or less , no illicit drugs .   2. Risk factor reduction:  Advised patient of need for regular exercise and diet rich and fruits and vegetables to reduce risk of heart attack and stroke.  Exercise- was doing well at beginning of year- work travel threw her off-  thinks will start in December- will get some walks in up at [facility] in next week with week off.  Diet/weight management-weight stable in last year- shed prefer 130s- was able to get this down but had some gain in summer months and wants to reverse this.  Wt Readings from Last 3 Encounters:  05/01/24 154 lb 3.2 oz (69.9 kg)  05/02/23 154 lb (69.9 kg)  11/01/22 150 lb (68 kg)  3. Immunizations/screenings/ancillary studies-declines COVID, flu. May do flu shot later Immunization History  Administered Date(s) Administered   Influenza, Seasonal, Injecte, Preservative  Fre 05/02/2023   Influenza,inj,Quad PF,6+ Mos 03/31/2021   Influenza-Unspecified 01/31/2016   PFIZER Comirnaty(Gray Top)Covid-19 Tri-Sucrose Vaccine 06/25/2020   PFIZER(Purple Top)SARS-COV-2 Vaccination 07/16/2019, 08/06/2019   Pfizer Covid-19 Vaccine Bivalent Booster 41yrs & up 06/25/2020   Tdap 06/01/2016   4. Cervical cancer screening- August 11 2018 HPV negative, normal Pap smear with repeat in 5 years per Dr. Rolan- we are trying to get records- she reports she had this in march- we are going of the get results 5. Breast cancer screening-sees GYN.  Did have a paternal aunt with breast cancer at in young 23's- she wants to see if she can get baseline with GYN. Declines gene connect because mom and aunt with no genetic component determined 6. Colon cancer screening - no family history, start at age 44 . No bright red blood per rectum or melena   7. Skin cancer screening- no dermatology - has seen in past and may update . advised regular sunscreen use. Denies worrisome, changing, or new skin lesions.  8. Birth control/STD check- on birth control - painful cycles and was told cysts on ultrasound- still has discomfofrt- see below. Only active with spouse  9. Osteoporosis screening at 34- will plan at later date 10. Smoking associated screening -former smoker-quit on new years last year will get UA- states doesn't even miss it! Plus getting CT with lower abdominal pain   Status of chronic or acute concerns   # Trigger thumb-did well with injection last year with Dr. 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 Shed liek to test husband to be sure. Tested 3 times - we opted out of repeat. 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 Had vaginal ultrasound 1-2 that was reassurig- ovarian cysts that were cyclical and went back on birth control to see if that would help but hasn't helped. Pain 3-5/10. Every day but not worsening.  -no nausea or vomiting. Regular bowel movements daily and not straining.  -history of kidney stones but doesn't feel like stone A/P: with ongoing pain- she agrees to abdominal CT with reassuring gynecology workup  Recommended follow up: Return in about 1 year (around 05/01/2025) for physical or sooner if needed.Schedule b4 you leave.  Lab/Order associations: fasting   ICD-10-CM   1. Encounter for general adult medical examination with abnormal findings  Z00.01     2. GAD (generalized anxiety disorder)  F41.1     3. Screening for hyperlipidemia  Z13.220 Lipid panel    Comprehensive metabolic panel    4. Screening for deficiency anemia  Z13.0 CBC with Differential/Platelet    5. Overweight  E66.3 HgB A1c  6. Screening for diabetes mellitus  Z13.1 HgB A1c    7. Former smoker  Z87.891 Urinalysis, Routine w reflex microscopic    8. Suprapubic pain  R10.24 CT ABDOMEN PELVIS W CONTRAST      No orders of the defined types were placed in this encounter.   Return precautions advised.  Garnette Lukes, MD

## 2024-05-01 NOTE — Addendum Note (Signed)
 Addended by: KATRINKA GARNETTE KIDD on: 05/01/2024 12:14 PM   Modules accepted: Orders

## 2024-05-01 NOTE — Addendum Note (Signed)
 Addended by: Viviane Semidey J on: 05/01/2024 11:34 AM   Modules accepted: Orders

## 2024-05-01 NOTE — Patient Instructions (Addendum)
 Schedule with new dentist  We will call you within two weeks about your referral for  CT abdomen/pelvis through [facility].  Their phone number is [phone].  Please call them if you have not heard in 1-2 weeks  Please stop by lab before you go If you have mychart- we will send your results within 3 business days of us  receiving them.  If you do not have mychart- we will call you about results within 5 business days of us  receiving them.  *please also note that you will see labs on mychart as soon as they post. I will later go in and write notes on them- will say notes from Dr. Katrinka   Recommended follow up: Return in about 1 year (around 05/01/2025) for physical or sooner if needed.Schedule b4 you leave. Worsening pain or new symptoms let us  know

## 2024-05-02 LAB — CBC WITH DIFFERENTIAL/PLATELET
Basophils Absolute: 0.1 x10E3/uL (ref 0.0–0.2)
Basos: 1 %
EOS (ABSOLUTE): 0.2 x10E3/uL (ref 0.0–0.4)
Eos: 2 %
Hematocrit: 42.8 % (ref 34.0–46.6)
Hemoglobin: 13.8 g/dL (ref 11.1–15.9)
Immature Grans (Abs): 0 x10E3/uL (ref 0.0–0.1)
Immature Granulocytes: 0 %
Lymphocytes Absolute: 2.7 x10E3/uL (ref 0.7–3.1)
Lymphs: 35 %
MCH: 31.6 pg (ref 26.6–33.0)
MCHC: 32.2 g/dL (ref 31.5–35.7)
MCV: 98 fL — ABNORMAL HIGH (ref 79–97)
Monocytes Absolute: 0.6 x10E3/uL (ref 0.1–0.9)
Monocytes: 7 %
Neutrophils Absolute: 4.3 x10E3/uL (ref 1.4–7.0)
Neutrophils: 55 %
Platelets: 308 x10E3/uL (ref 150–450)
RBC: 4.37 x10E6/uL (ref 3.77–5.28)
RDW: 11.6 % — ABNORMAL LOW (ref 11.7–15.4)
WBC: 7.9 x10E3/uL (ref 3.4–10.8)

## 2024-05-02 LAB — URINALYSIS, ROUTINE W REFLEX MICROSCOPIC
Bilirubin, UA: NEGATIVE
Glucose, UA: NEGATIVE
Ketones, UA: NEGATIVE
Nitrite, UA: NEGATIVE
Protein,UA: NEGATIVE
RBC, UA: NEGATIVE
Specific Gravity, UA: 1.018 (ref 1.005–1.030)
Urobilinogen, Ur: 0.2 mg/dL (ref 0.2–1.0)
pH, UA: 5 (ref 5.0–7.5)

## 2024-05-02 LAB — LIPID PANEL
Chol/HDL Ratio: 2.5 ratio (ref 0.0–4.4)
Cholesterol, Total: 156 mg/dL (ref 100–199)
HDL: 62 mg/dL (ref >39)
LDL Chol Calc (NIH): 83 mg/dL (ref 0–99)
Triglycerides: 51 mg/dL (ref 0–149)
VLDL Cholesterol Cal: 11 mg/dL (ref 5–40)

## 2024-05-02 LAB — MICROSCOPIC EXAMINATION
Casts: NONE SEEN /LPF
RBC, Urine: NONE SEEN /HPF (ref 0–2)

## 2024-05-02 LAB — HEMOGLOBIN A1C
Est. average glucose Bld gHb Est-mCnc: 94 mg/dL
Hgb A1c MFr Bld: 4.9 % (ref 4.8–5.6)

## 2024-05-02 LAB — COMPREHENSIVE METABOLIC PANEL WITH GFR
ALT: 20 IU/L (ref 0–32)
AST: 17 IU/L (ref 0–40)
Albumin: 4.5 g/dL (ref 3.9–4.9)
Alkaline Phosphatase: 73 IU/L (ref 41–116)
BUN/Creatinine Ratio: 18 (ref 9–23)
BUN: 13 mg/dL (ref 6–20)
Bilirubin Total: 0.4 mg/dL (ref 0.0–1.2)
CO2: 21 mmol/L (ref 20–29)
Calcium: 9.1 mg/dL (ref 8.7–10.2)
Chloride: 104 mmol/L (ref 96–106)
Creatinine, Ser: 0.74 mg/dL (ref 0.57–1.00)
Globulin, Total: 2.5 g/dL (ref 1.5–4.5)
Glucose: 90 mg/dL (ref 70–99)
Potassium: 4.1 mmol/L (ref 3.5–5.2)
Sodium: 142 mmol/L (ref 134–144)
Total Protein: 7 g/dL (ref 6.0–8.5)
eGFR: 107 mL/min/1.73 (ref >59)

## 2024-05-04 ENCOUNTER — Ambulatory Visit: Payer: Self-pay | Admitting: Family Medicine

## 2024-05-15 ENCOUNTER — Inpatient Hospital Stay
Admission: RE | Admit: 2024-05-15 | Discharge: 2024-05-15 | Disposition: A | Source: Ambulatory Visit | Attending: Family Medicine | Admitting: Family Medicine

## 2024-05-15 DIAGNOSIS — R1024 Suprapubic pain: Secondary | ICD-10-CM

## 2024-05-15 MED ORDER — IOPAMIDOL (ISOVUE-300) INJECTION 61%
100.0000 mL | Freq: Once | INTRAVENOUS | Status: AC | PRN
  Administered 2024-05-15: 100 mL via INTRAVENOUS

## 2024-06-29 ENCOUNTER — Telehealth: Payer: Self-pay | Admitting: Family Medicine

## 2024-06-29 NOTE — Telephone Encounter (Signed)
 Completed and ready for pickup

## 2024-06-29 NOTE — Telephone Encounter (Signed)
 Type of form received: Physician wellness screening results  Additional comments:   Received by: Therisa w  Form should be Faxed to:  Form should be mailed to:    Is patient requesting call for pickup: yes   Form placed:  PCP Inbox  Attach charge sheet. yes  Individual made aware of 3-5 business day turn around (Y/N)? yes

## 2025-05-14 ENCOUNTER — Encounter: Admitting: Family Medicine
